# Patient Record
Sex: Female | Born: 1955 | Race: White | Hispanic: No
Health system: Midwestern US, Academic
[De-identification: ages and names within clinical notes are randomized; demographics above are authoritative.]

---

## 2016-06-04 MED ORDER — TOPIRAMATE 50 MG PO TAB
50 mg | ORAL_TABLET | Freq: Every evening | ORAL | 1 refills | Status: DC
Start: 2016-06-04 — End: 2016-11-04

## 2016-07-21 IMAGING — US DUPLEX LOW EXTREM VEINS LT
1 series · 14 of 25 positions shown · non-contrast
Comparison: None available

ULTRASOUND REPORT

US VENOUS LOWER EXTREMITY LEFT
INDICATION: Trauma
TECHNIQUE: Real-time grayscale, color duplex, gated duplex of the left lower
extremity veins

[Series 1: duplex low extrem veins left · 14 of 50 slices shown]
[im 1/50]
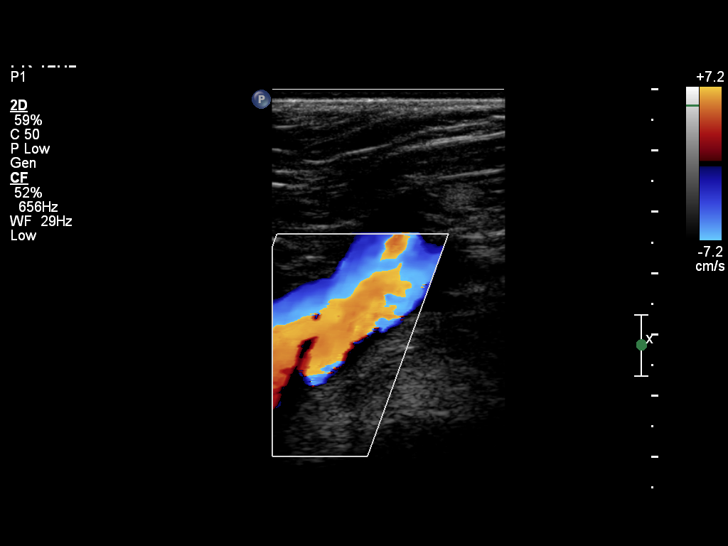
[im 5/50]
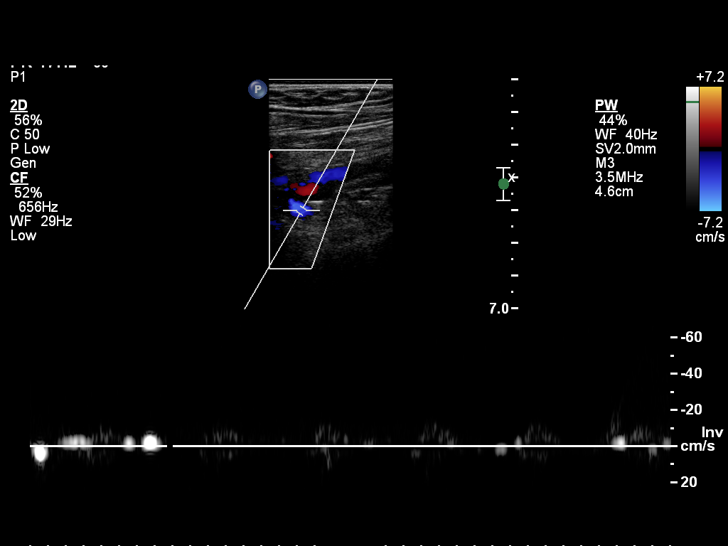
[im 9/50]
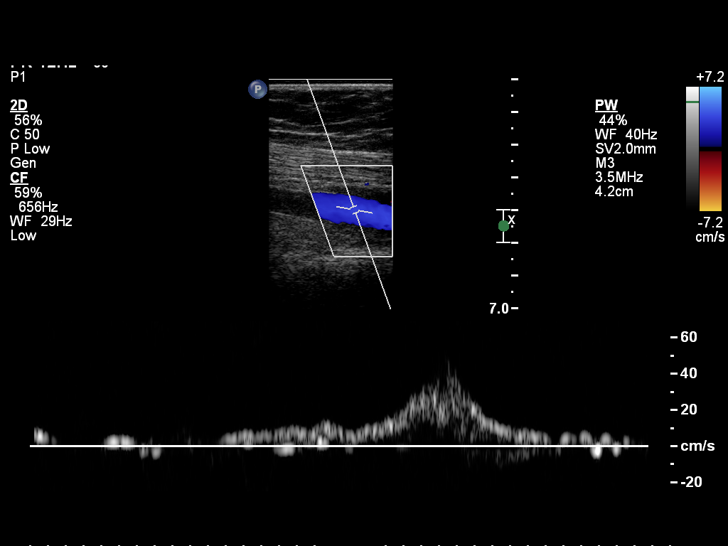
[im 13/50]
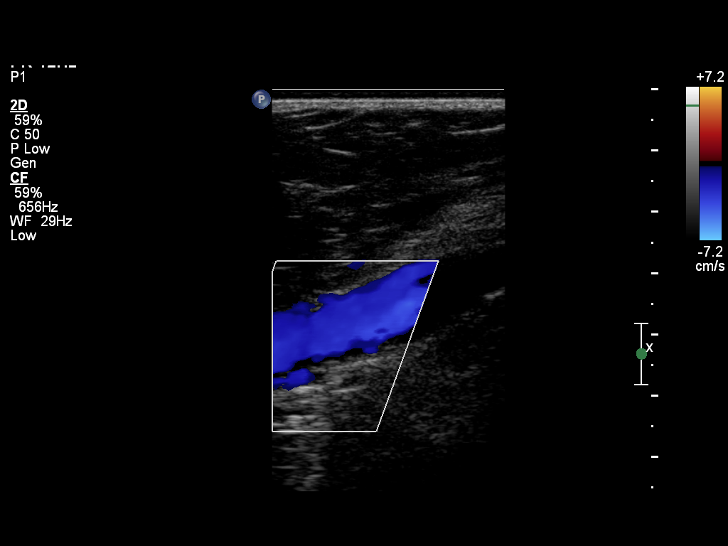
[im 17/50]
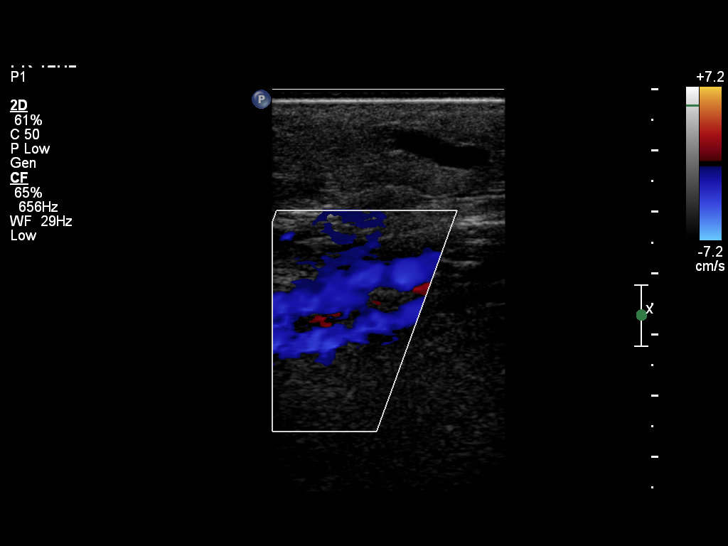
[im 19/50]
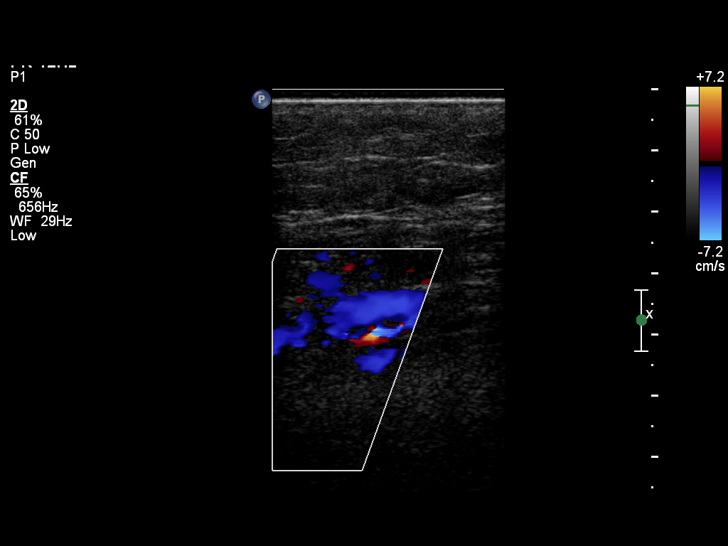
[im 23/50]
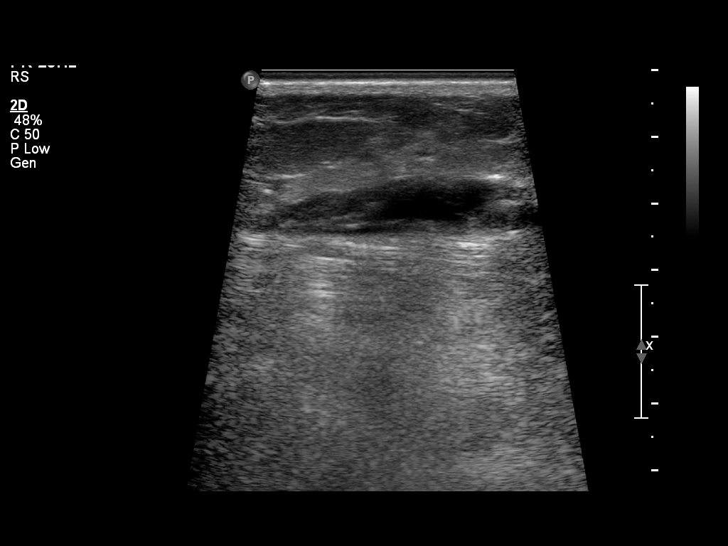
[im 27/50]
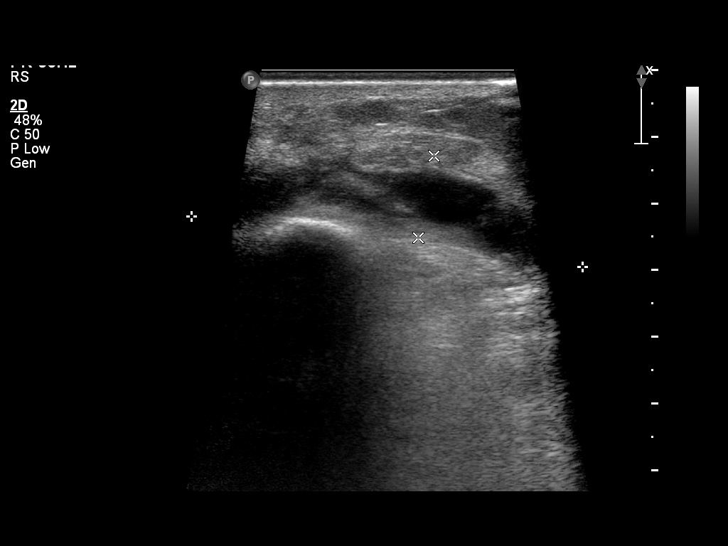
[im 31/50]
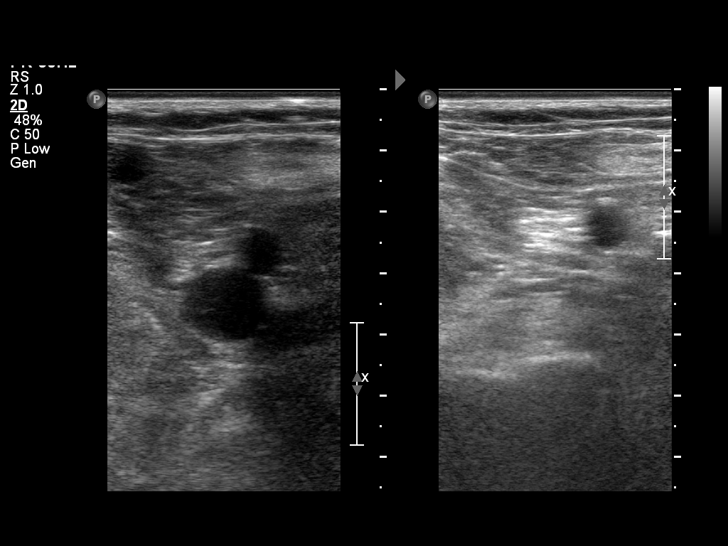
[im 33/50]
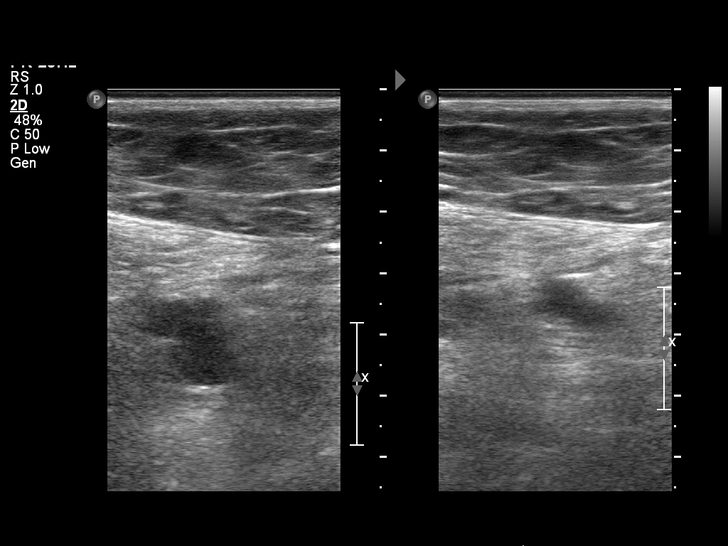
[im 37/50]
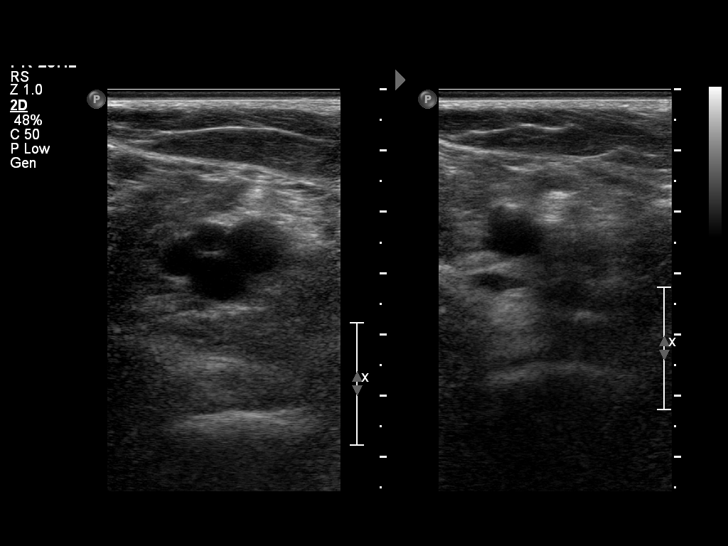
[im 41/50]
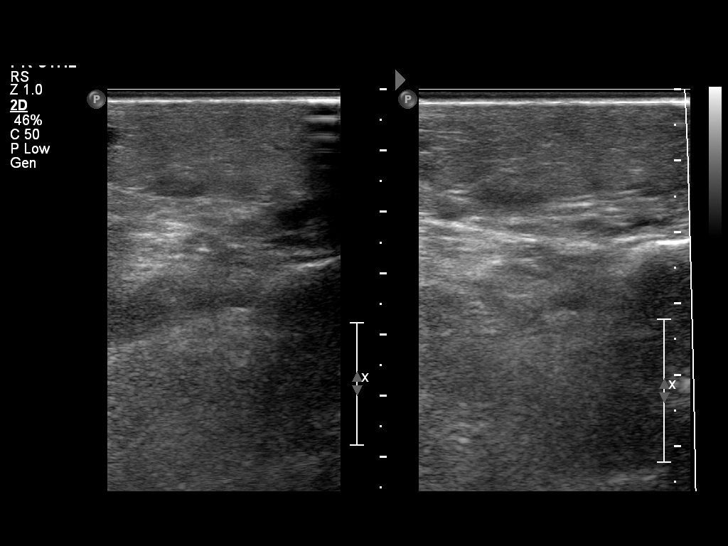
[im 45/50]
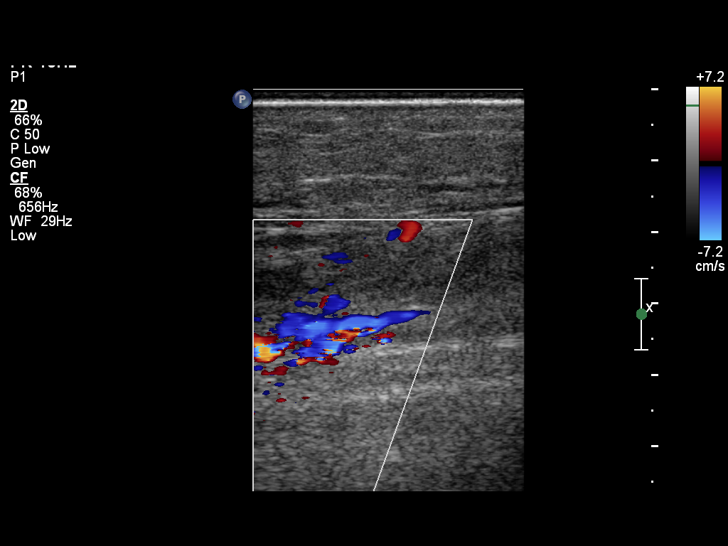
[im 50/50]
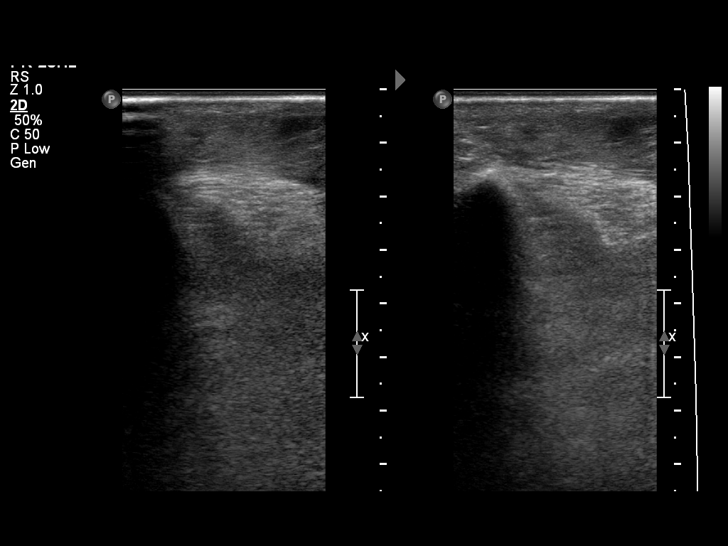

[14 of 25 positions shown; findings below may reference images not displayed]

IMPRESSION: No evidence of venous thrombosis
Subcutaneous hematoma anterior to the proximal tibia and fibula
FINDINGS: There is spontaneous, phasic, augmentable, competent, and nonpulsatile flow
of the left extremity veins
There is no distention or noncompressibility.
There is a 74 ml 59 x 12.5 mm mildly complex hypoechoic area tissues along
the proximal anterior tibia and fibula consistent with a subcutaneous hematoma

Tech Notes: LE SWELLING, BRUISING AFTER FALL. NOVAFORMA

## 2016-08-18 ENCOUNTER — Encounter: Admit: 2016-08-18 | Discharge: 2016-08-18 | Payer: BC Managed Care – PPO

## 2016-08-18 NOTE — Telephone Encounter
Pt is overdue for visit. Janett Billow, LPN

## 2016-08-19 ENCOUNTER — Encounter: Admit: 2016-08-19 | Discharge: 2016-08-19 | Payer: BC Managed Care – PPO

## 2016-08-19 MED FILL — MYCOPHENOLATE SODIUM 180 MG PO TBEC: 180 mg | ORAL | 30 days supply | Qty: 180 | Fill #3 | Status: AC

## 2016-08-19 MED FILL — NALTREXONE 50 MG PO TAB: 50 mg | ORAL | 90 days supply | Qty: 90 | Fill #3 | Status: AC

## 2016-08-19 MED FILL — TACROLIMUS 1 MG PO CAP: 1 mg | ORAL | 30 days supply | Qty: 360 | Fill #3 | Status: AC

## 2016-08-21 MED ORDER — SERTRALINE 50 MG PO TAB
50 mg | ORAL_TABLET | Freq: Every day | ORAL | 2 refills | Status: AC
Start: 2016-08-21 — End: 2016-11-04
  Filled 2016-08-22 (×2): qty 30, 30d supply, fill #1

## 2016-08-21 NOTE — Telephone Encounter
Please ask pt to schedule follow up with me. Thanks

## 2016-08-22 ENCOUNTER — Encounter: Admit: 2016-08-22 | Discharge: 2016-08-22 | Payer: BC Managed Care – PPO

## 2016-08-27 ENCOUNTER — Encounter: Admit: 2016-08-27 | Discharge: 2016-08-27 | Payer: BC Managed Care – PPO

## 2016-08-27 NOTE — Telephone Encounter
Please call pt with recent lab results that were drawn at [facility].

## 2016-08-29 NOTE — Telephone Encounter
Returned call.  Reviewed 08/26/16 lab results with pt, informed of stable results.  Pt v/u.

## 2016-09-02 ENCOUNTER — Encounter: Admit: 2016-09-02 | Discharge: 2016-09-02 | Payer: BC Managed Care – PPO

## 2016-09-02 DIAGNOSIS — Z79899 Other long term (current) drug therapy: ICD-10-CM

## 2016-09-02 DIAGNOSIS — Z944 Liver transplant status: Principal | ICD-10-CM

## 2016-09-08 ENCOUNTER — Encounter: Admit: 2016-09-08 | Discharge: 2016-09-08 | Payer: BC Managed Care – PPO

## 2016-09-08 DIAGNOSIS — Z944 Liver transplant status: Principal | ICD-10-CM

## 2016-09-08 DIAGNOSIS — Z79899 Other long term (current) drug therapy: ICD-10-CM

## 2016-09-08 LAB — COMPREHENSIVE METABOLIC PANEL
Lab: 0.3 mg/dL (ref 0.2–1.0)
Lab: 0.9 mg/dL (ref >60)
Lab: 10 IU/L — ABNORMAL LOW (ref 15–37)
Lab: 106 mmol/L (ref 98–107)
Lab: 12 U/L (ref 12–78)
Lab: 141 mmol/L (ref 136–145)
Lab: 24 mmol/L (ref 21–32)
Lab: 3.5 g/dL (ref 3.4–5.0)
Lab: 4 mmol/L (ref 3.5–5.1)
Lab: 6.1 g/dL — ABNORMAL LOW (ref >60)
Lab: 60 IU/L (ref 46–116)
Lab: 8.7 mg/dL (ref 8.5–10.1)
Lab: 9 mg/dL (ref 7–18)
Lab: 94 mg/dL (ref 74–106)
Lab: >60 mL/min/1.73
Lab: >60 mL/min/{1.73_m2}

## 2016-09-08 LAB — CBC AND DIFF
Lab: 2.9 %
Lab: 4.1 10*3/uL — ABNORMAL LOW (ref 4.5–10.5)

## 2016-09-08 LAB — LIPID PROFILE
Lab: 110 mg/dL (ref 0–129)
Lab: 172 mg/dL (ref 5–200)
Lab: 46 mg/dL (ref 40–60)
Lab: 82 mg/dL — AB (ref 15–150)

## 2016-09-08 LAB — URINALYSIS DIPSTICK REFLEX TO CULTURE
Lab: 0.2 mg/dL (ref <=0.2)
Lab: 1 (ref 1.003–1.035)
Lab: 6.5 (ref 5.0–7.0)
Lab: NEGATIVE
Lab: NEGATIVE

## 2016-09-08 LAB — URINALYSIS MICROSCOPIC REFLEX TO CULTURE

## 2016-09-08 LAB — GGTP: Lab: 8 IU/L (ref 0–60)

## 2016-09-08 LAB — 25-OH VITAMIN D (D2 + D3): Lab: 24 ng/mL — ABNORMAL LOW (ref 30.0–100.0)

## 2016-09-09 ENCOUNTER — Encounter: Admit: 2016-09-09 | Discharge: 2016-09-09 | Payer: BC Managed Care – PPO

## 2016-09-09 DIAGNOSIS — Z79899 Other long term (current) drug therapy: ICD-10-CM

## 2016-09-09 DIAGNOSIS — Z944 Liver transplant status: Principal | ICD-10-CM

## 2016-09-15 ENCOUNTER — Encounter: Admit: 2016-09-15 | Discharge: 2016-09-15 | Payer: BC Managed Care – PPO

## 2016-09-15 MED FILL — MYCOPHENOLATE SODIUM 180 MG PO TBEC: 180 mg | ORAL | 30 days supply | Qty: 180 | Fill #4 | Status: AC

## 2016-09-15 MED FILL — TACROLIMUS 1 MG PO CAP: 1 mg | ORAL | 30 days supply | Qty: 360 | Fill #4 | Status: AC

## 2016-09-17 ENCOUNTER — Encounter: Admit: 2016-09-17 | Discharge: 2016-09-17 | Payer: BC Managed Care – PPO

## 2016-09-23 ENCOUNTER — Encounter: Admit: 2016-09-23 | Discharge: 2016-09-23 | Payer: BC Managed Care – PPO

## 2016-09-23 NOTE — Progress Notes
Attempted to call Audrey Oliver to verify compliance and assess tolerance of her specialty medications (tacrolimus, mycophenolate). No answer. Left voicemail asking patient to return call to pharmacist at [phone].    Anson Crofts, [facility]

## 2016-09-26 ENCOUNTER — Encounter: Admit: 2016-09-26 | Discharge: 2016-09-26 | Payer: BC Managed Care – PPO

## 2016-09-26 MED FILL — TOPIRAMATE 50 MG PO TAB: 50 mg | ORAL | 90 days supply | Qty: 90 | Fill #1 | Status: AC

## 2016-09-26 NOTE — Progress Notes
Periodic Immunosuppressant Medication Reassessment   Prograf (tacrolimus) ??? calcineurin inhibitor  Myfortic (mycophenolate sodium) ??? antimetabolite    Appropriateness of Therapy:  Tacrolimus and mycophenolate have been appropriately prescribed for immunosuppression following organ transplantation for  Audrey Oliver.    The following doses and frequencies have been prescribed:    ??? Tacrolimus 2 mg by mouth twice daily; dosing to be modified for a trough goal of ~5 mcg/mL  ???  ??? Mycophenolate sodium 180 mg twice daily    These medications will be continued indefinitely or for a duration determined to be appropriate by the transplant prescriber.    The immunosuppressive regimen above has been prescribed to prevent rejection after liver and kidney transplantation.    Allergies:  A review of the patient???s allergies and medication reconciliation have been completed.    Allergies   Allergen Reactions   ??? Morphine ITCHING   ??? Erythromycin STOMACH UPSET       Medication Reconciliation:  A medication history and reconciliation were performed (including prescription medications, supplements, over the counter, and herbal products). The medication list was updated and the patients??? current medication list is included below.  Prior to Admission medications    Medication Sig Start Date End Date Taking? Authorizing Provider   aspirin EC 81 mg tablet Take 81 mg by mouth daily. Take with food.    HISTORICAL PROVIDER   cholecalciferol (Vitamin D3) (VITAMIN D-3) 1,000 units tablet Take 2,000 Units by mouth daily.    Provider, Historical   gabapentin (NEURONTIN) 100 mg capsule Take 1 capsule by mouth three times daily. 05/07/16   Rodena Piety, MD   mycophenolate DR (MYFORTIC) 180 mg TbEC tablet Take 1 tablet by mouth twice daily. 05/07/16   Rodena Piety, MD   naltrexone (DEPADE) 50 mg tablet Take 1 tablet by mouth daily. 05/07/16   Rodena Piety, MD   omeprazole DR(+) (PRILOSEC) 20 mg capsule Take 1 capsule by mouth daily. 05/07/16   Rodena Piety, MD   propranolol (INDERAL) 10 mg tablet Take 2 tablets by mouth twice daily. 05/07/16   Rodena Piety, MD   sertraline (ZOLOFT) 50 mg tablet TAKE ONE TABLET BY MOUTH ONCE DAILY 08/21/16   Polsak, Micholee B, DO   tacrolimus (PROGRAF) 1 mg capsule Take 2 capsules by mouth twice daily. 05/07/16   Rodena Piety, MD   topiramate (TOPAMAX) 50 mg tablet Take 1 tablet by mouth at bedtime daily. 06/04/16   Rodena Piety, MD   traZODone (DESYREL) 100 mg tablet Take 1 tablet by mouth at bedtime daily. 05/07/16   Rodena Piety, MD   vitamins, multiple tablet Take 1 Tab by mouth daily.    HISTORICAL PROVIDER       Drug-Drug Interactions:  Drug-drug and drug-food interactions between the patients??? specialty medication and their medication list were assessed and reviewed with the patient.     The patient was counseled to avoid taking non-steroidal anti-inflammatory drugs or herbal supplements and to contact their transplant physician or pharmacist before stopping or starting any medications.    Drug-Food Interactions:  The patient was counseled to avoid consuming grapefruit and grapefruit juice or pomegranate and pomegranate juice.  While their medications can be taken without regard to food, the patient was strongly encouraged to take their medications with food for better tolerability.    Pregnancy status:    Female of child-bearing potential:  provided with education to contact her physician if she becomes pregnant or plans to become pregnant.    REMS programs:  No REMS are required for any of the prescribed medications for this patient.     Patient Assessments:    Adverse Effects:  Side effects of medications were questioned and the patient reported they are not experiencing any significant adverse effects to the medication(s)    Adherence:  Patient???s adherence to therapy was reviewed and they self-reported they have missed 7 doses per week.  Recommended setting an alarm on phone to help with adherence Audrey Oliver was educated on the importance of adherence.    Audrey Oliver is adherent with medication refills with refills     Subjective clinical assessment: on a scale of 1 to 10, the patient rates they are feeling 10 out of 10 while on treatment.    Subjective Quality of Life Measurement:  In the past 30 days, Audrey Oliver was able to complete all normal daily activities.    Audrey Oliver  was given the opportunity to ask questions and did not have any questions at this time.     Followup Plan:  Re-assessment has been completed. The patient will be assessed again in 1 year.      Morrison Old, PHARMD

## 2016-09-29 ENCOUNTER — Encounter: Admit: 2016-09-29 | Discharge: 2016-09-29 | Payer: BC Managed Care – PPO

## 2016-10-17 ENCOUNTER — Encounter: Admit: 2016-10-17 | Discharge: 2016-10-17 | Payer: BC Managed Care – PPO

## 2016-10-17 MED FILL — MYCOPHENOLATE SODIUM 180 MG PO TBEC: 180 mg | ORAL | 30 days supply | Qty: 180 | Fill #5 | Status: AC

## 2016-10-17 MED FILL — TACROLIMUS 1 MG PO CAP: 1 mg | ORAL | 30 days supply | Qty: 360 | Fill #5 | Status: AC

## 2016-10-21 ENCOUNTER — Encounter: Admit: 2016-10-21 | Discharge: 2016-10-21 | Payer: BC Managed Care – PPO

## 2016-10-30 ENCOUNTER — Encounter: Admit: 2016-10-30 | Discharge: 2016-10-30 | Payer: BC Managed Care – PPO

## 2016-10-30 NOTE — Telephone Encounter
Mary from [facility] LVM requesting new standing orders for pt be sent to [phone].

## 2016-10-31 ENCOUNTER — Encounter: Admit: 2016-10-31 | Discharge: 2016-10-31 | Payer: BC Managed Care – PPO

## 2016-10-31 DIAGNOSIS — Z940 Kidney transplant status: Principal | ICD-10-CM

## 2016-10-31 DIAGNOSIS — B349 Viral infection, unspecified: ICD-10-CM

## 2016-10-31 NOTE — Progress Notes
Patients chart is scanned in [facility] box number. [id number]

## 2016-11-04 ENCOUNTER — Ambulatory Visit: Admit: 2016-11-04 | Discharge: 2016-11-04 | Payer: MEDICARE

## 2016-11-04 ENCOUNTER — Encounter: Admit: 2016-11-04 | Discharge: 2016-11-04 | Payer: BC Managed Care – PPO

## 2016-11-04 ENCOUNTER — Ambulatory Visit: Admit: 2016-11-04 | Discharge: 2016-11-05 | Payer: BC Managed Care – PPO

## 2016-11-04 ENCOUNTER — Ambulatory Visit: Admit: 2016-11-04 | Discharge: 2016-11-04 | Payer: BC Managed Care – PPO

## 2016-11-04 DIAGNOSIS — I499 Cardiac arrhythmia, unspecified: ICD-10-CM

## 2016-11-04 DIAGNOSIS — Z79899 Other long term (current) drug therapy: ICD-10-CM

## 2016-11-04 DIAGNOSIS — R609 Edema, unspecified: ICD-10-CM

## 2016-11-04 DIAGNOSIS — Z940 Kidney transplant status: Secondary | ICD-10-CM

## 2016-11-04 DIAGNOSIS — I10 Essential (primary) hypertension: ICD-10-CM

## 2016-11-04 DIAGNOSIS — K5792 Diverticulitis of intestine, part unspecified, without perforation or abscess without bleeding: ICD-10-CM

## 2016-11-04 DIAGNOSIS — Z944 Liver transplant status: Principal | ICD-10-CM

## 2016-11-04 DIAGNOSIS — F1010 Alcohol abuse, uncomplicated: ICD-10-CM

## 2016-11-04 DIAGNOSIS — D259 Leiomyoma of uterus, unspecified: ICD-10-CM

## 2016-11-04 DIAGNOSIS — E669 Obesity, unspecified: ICD-10-CM

## 2016-11-04 DIAGNOSIS — B349 Viral infection, unspecified: ICD-10-CM

## 2016-11-04 DIAGNOSIS — Z9289 Personal history of other medical treatment: ICD-10-CM

## 2016-11-04 DIAGNOSIS — T7840XA Allergy, unspecified, initial encounter: ICD-10-CM

## 2016-11-04 DIAGNOSIS — G4730 Sleep apnea, unspecified: ICD-10-CM

## 2016-11-04 DIAGNOSIS — K7290 Hepatic failure, unspecified without coma: Principal | ICD-10-CM

## 2016-11-04 DIAGNOSIS — E785 Hyperlipidemia, unspecified: ICD-10-CM

## 2016-11-04 DIAGNOSIS — D539 Nutritional anemia, unspecified: ICD-10-CM

## 2016-11-04 DIAGNOSIS — R748 Abnormal levels of other serum enzymes: ICD-10-CM

## 2016-11-04 DIAGNOSIS — F419 Anxiety disorder, unspecified: ICD-10-CM

## 2016-11-04 DIAGNOSIS — Z780 Asymptomatic menopausal state: ICD-10-CM

## 2016-11-04 DIAGNOSIS — N289 Disorder of kidney and ureter, unspecified: ICD-10-CM

## 2016-11-04 LAB — COMPREHENSIVE METABOLIC PANEL
Lab: 138 MMOL/L (ref 137–147)
Lab: 16 mg/dL (ref 7–25)
Lab: 4.1 MMOL/L (ref 3.5–5.1)
Lab: 9.2 mg/dL (ref 8.5–10.6)
Lab: 97 mg/dL (ref 70–100)

## 2016-11-04 LAB — URINALYSIS DIPSTICK REFLEX TO CULTURE
Lab: NEGATIVE
Lab: NEGATIVE K/UL (ref 0–0.20)
Lab: NEGATIVE mL/min (ref 0–0.45)
Lab: NEGATIVE
Lab: NEGATIVE

## 2016-11-04 LAB — URINALYSIS MICROSCOPIC REFLEX TO CULTURE

## 2016-11-04 LAB — TACROLIMUS IMMUNOASSAY (FK506): Lab: 6.1 ng/mL (ref 2–15)

## 2016-11-04 LAB — CBC AND DIFF
Lab: 4.8 K/UL (ref 4.5–11.0)
Lab: 4.9 M/UL (ref 4.0–5.0)

## 2016-11-04 LAB — GGTP: Lab: 10 U/L — ABNORMAL HIGH (ref 9–64)

## 2016-11-04 MED ORDER — MYCOPHENOLATE SODIUM 180 MG PO TBEC
180 mg | ORAL_TABLET | Freq: Two times a day (BID) | ORAL | 1 refills | Status: AC
Start: 2016-11-04 — End: 2017-05-15
  Filled 2016-11-05 (×2): qty 180, 30d supply, fill #1

## 2016-11-04 MED ORDER — TRAZODONE 100 MG PO TAB
100 mg | ORAL_TABLET | Freq: Every evening | ORAL | 1 refills | Status: AC
Start: 2016-11-04 — End: 2017-05-15
  Filled 2016-11-05 (×2): qty 90, 90d supply, fill #1

## 2016-11-04 MED ORDER — FUROSEMIDE 20 MG PO TAB
20 mg | ORAL_TABLET | Freq: Every day | ORAL | 1 refills | 90.00000 days | Status: AC | PRN
Start: 2016-11-04 — End: 2017-07-23

## 2016-11-04 MED ORDER — OMEPRAZOLE 20 MG PO CPDR
20 mg | ORAL_CAPSULE | Freq: Every day | ORAL | 1 refills | Status: AC
Start: 2016-11-04 — End: 2017-05-15
  Filled 2016-11-05 (×2): qty 90, 90d supply, fill #1

## 2016-11-04 MED ORDER — TACROLIMUS 1 MG PO CAP
2 mg | ORAL_CAPSULE | Freq: Two times a day (BID) | ORAL | 1 refills | 30.00000 days | Status: AC
Start: 2016-11-04 — End: 2017-05-15
  Filled 2016-11-05 (×2): qty 360, 30d supply, fill #1

## 2016-11-04 MED ORDER — SERTRALINE 50 MG PO TAB
50 mg | ORAL_TABLET | Freq: Every day | ORAL | 1 refills | Status: AC
Start: 2016-11-04 — End: 2017-09-21

## 2016-11-04 MED ORDER — PROPRANOLOL 10 MG PO TAB
20 mg | ORAL_TABLET | Freq: Two times a day (BID) | ORAL | 1 refills | Status: AC
Start: 2016-11-04 — End: 2017-05-15
  Filled 2016-11-05 (×2): qty 360, 90d supply, fill #1

## 2016-11-04 NOTE — Telephone Encounter
Pt LVM returning call to [facility] regarding lab results.

## 2016-11-04 NOTE — Telephone Encounter
Called and reviewed lab results with patient.  Cr and LFT's stable.  Prograf level 6.1.  IS managed by liver tsp team.

## 2016-11-04 NOTE — Progress Notes
BP at home 120/80.  Denies N/V/heartburn/diarrhea/constipation/dysuria.  Furosemide 20mg  prn use for LE swelling.  Needs refill.    Last Prograf dosage 21:45  RTC in 6 months  Have labs done every 2 months

## 2016-11-04 NOTE — Progress Notes
11/04/16  Audrey Oliver  DOB: 08/21/1955  MRN: [id number]      Date of Service: 11/04/2016          History of Present Illness    Audrey Oliver is a 61 y.o. 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 She has resumed alcohol intake post transplant that required inpatient rehab. She endorses alcohol abstinence since 02/24/2016. She has been taking care of her daughter that is suffering from liver complications due to alcohol abuse. Her daughter lives in [place] and she has been traveling there. She presents to our transplant clinic today for routine follow up. Her liver graft function remains normal.     Her home blood pressures are 120s/80s. She has lost weight by using CBD oil weight loss spray. 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 She has lost 15 pounds since starting this. She reports improvement in her energy and sleep mood.    She takes Furosemide on occasion for BLE. She is trying to stop smoking but due to recent family stressors, she has had difficulty with tobacco cessation.    TRANSPLANT SYNOPSIS:  Date:02/23/2012  Transplant Type:SLK (alcohol, HRS)  KDPI:9%  Induction:Thymoglobulin      Review of Systems  Constitutional/General: Negative for fever, chills, weakness, anorexia, fatigue, malaise or unintentional weight loss/weight gain. As per HPI  HEENT:  Negative for headache, diplopia, tinnitus, rhinorrhea, epistaxis, sore throat, mouth ulcers  Lymphatics: Negative for enlarged lymph nodes  Respiratory: Negative for cough, SOA, DOE  Cardiovascular: Negative for chest pain, SOA, palpitations, orthopnea, PND, swelling  Gastrointestinal: Negative for abdominal pain, nausea, vomiting, diarrhea, constipation  Genitourinary: Negative for flank pain, burning micturition, frequency, bloody urine, oliguria  Extremities: Negative for leg swelling  M/S: Negative for joint swelling, redness, pain Skin: Negative for rash, itching or lesions  Endocrine: Negative for sweating, cold or heat intolerance. No polyuria or polydipsia  Neurologic: Negative for muscle weakness, numbness, tingling.  Hematologic: Negative for anemia, bleeding or bruising  Psychiatric: Negative for thought/mood disorder  Allergic/Immunologic: Negative for hives, pruritus      Pertinent positive and negative systems are noted above and/or in HPI          Allergies   Allergen Reactions   ??? Morphine ITCHING   ??? Erythromycin STOMACH UPSET         Past Medical History:   Diagnosis Date   ??? Alcohol abuse    ??? Allergy    ??? Anxiety disorder    ??? Arrhythmia     with anxiety and had some fluttering with fistula placement 10/03/11   ??? Depression (disease)     used to be on prozac   ??? Diverticulitis     s/p sigmoid resection   ??? Edema    ??? Elevated liver enzymes    ??? End-stage liver disease (HCC)     2006, quit drinking, relapsed and started drinking in March and decompensated; s/p OLT 12/13    ??? History of blood transfusion    ??? HTN (hypertension)     with anxiety   ??? Hyperlipemia     LDL recently 140 and patient denies any h/o high cholesterol in the past   ??? Kidney disease     Hepatorenal on HD short time s/p Kidney transplant 12/13   ??? Obesity    ??? Postmenopausal     05/2011 last menstrual period   ??? Sleep apnea  Prior with 100lbs weight loss with possible diagnosis   ??? Unspecified deficiency anemia    ??? Uterine fibroid     s/p Hysteroscopy, polypectomy, dilation, and curettage       Past Surgical History:   Procedure Laterality Date   ??? CARDIOVASCULAR STRESS TEST  2011    at [facility] - Passed with flying colors   ??? DOPPLER ECHOCARDIOGRAPHY  10/2011    at [facility]   ??? DILATION AND CURETTAGE  11/08/12    Hysteroscopy, polypectomy, dilation, and curettage   ??? AV FISTULA REPAIR     ??? DIALYSIS FISTULA CREATION      Right lower arm   ??? ELECTROCARDIOGRAM     ??? HX BOWEL RESECTION      Sigmoid from diverticulitis   ??? HX CHOLECYSTECTOMY ??? HX TONSILLECTOMY     ??? KIDNEY TRANSPLANT     ??? LIVER TRANSPLANT         Family History   Problem Relation Age of Onset   ??? Unknown to Patient Mother    ??? Unknown to Patient Father    ??? Anesthetic Complication Neg Hx    ??? Cancer Neg Hx    ??? Cancer-Breast Neg Hx    ??? Cancer-Colon Neg Hx    ??? Cancer-Ovarian Neg Hx    ??? Cancer-Uterine Neg Hx    ??? Bleeding Disorders Neg Hx    ??? Diabetes Neg Hx    ??? Heart Disease Neg Hx    ??? VTE Neg Hx      Social History     Social History   ??? Marital status: Separated     Spouse name: N/A   ??? Number of children: N/A   ??? Years of education: N/A     Occupational History   ???  Disabled     Social History Main Topics   ??? Smoking status: Current Every Day Smoker     Packs/day: 1.00     Years: 30.00     Types: Cigarettes     Last attempt to quit: 12/02/2010   ??? Smokeless tobacco: Never Used   ??? Alcohol use No      Comment: started age 39; Quit in 2006 and started back in 2011, quit March 2013. Used to drink vodka, though wine/beer this last time.   ??? Drug use: No   ??? Sexual activity: Yes     Partners: Male      Comment: no h/o STD     Other Topics Concern   ??? Not on file     Social History Narrative    From [place], graduated from [employer], lived in [place] 12 years, moved to [place] 25 yrs, then here in 2010.  She was a hair stylist and women's retail.  Last worked a couple years ago.  Boyfriend is Gwynneth Munson, still married to [place] but legally separated and they are all 3 buddies.  1 daughter, Audrey Oliver - in [place].          Objective:           Vitals:    11/04/16 0911   BP: 130/85   Pulse: 73   Weight: 108.9 kg (240 lb)     Body mass index is 36.49 kg/m???.     Physical Exam    General: NAD, A+Ox4, calm and pleasant. Appears to be stated age.   HENT: Unremarkable, no oral lesions  Neck: Normal ROM, no LAD, no JVD  Lungs: Bilat. CTA  CV:  RRR, S1, S2 without carotid bruit or murmur, no edema  Abdomen: Soft, N/D, N/T without hepatosplenomegaly  M/S: Normal ROM and strength Neuro: Nonfocal deficits without tremors  Skin: No skin rash  Psyc: Stable        ??? aspirin EC 81 mg tablet Take 81 mg by mouth daily. Take with food.   ??? cholecalciferol (Vitamin D3) (VITAMIN D-3) 1,000 units tablet Take 2,000 Units by mouth daily.   ??? mycophenolate DR (MYFORTIC) 180 mg TbEC tablet Take 1 tablet by mouth twice daily.   ??? omeprazole DR(+) (PRILOSEC) 20 mg capsule Take one capsule by mouth daily.   ??? propranolol (INDERAL) 10 mg tablet Take two tablets by mouth twice daily.   ??? sertraline (ZOLOFT) 50 mg tablet Take one tablet by mouth daily.   ??? tacrolimus (PROGRAF) 1 mg capsule Take 2 capsules by mouth twice daily.   ??? traZODone (DESYREL) 100 mg tablet Take one tablet by mouth at bedtime daily.   ??? vitamins, multiple tablet Take 1 Tab by mouth daily.       Labs and Diagnostic Tests:  Urinalysis:  Lab Results   Component Value Date/Time    UCOLOR Yellow 08/26/2016 08:15 AM    TURBID Clear 08/26/2016 08:15 AM    USPGR 1.015 08/26/2016 08:15 AM    UPH 6.5 08/26/2016 08:15 AM    UPROTEIN Negative 08/26/2016 08:15 AM    UAGLU Negative 08/26/2016 08:15 AM    UKET Negative 08/26/2016 08:15 AM    UBILE Negative 08/26/2016 08:15 AM    UBLD NEGATIVE 08/26/2016 08:15 AM    UROB 0.2 08/26/2016 08:15 AM       CBC with Diff:  CBC with Diff Latest Ref Rng & Units 08/26/2016 05/05/2016   WBC 4.5 - 10.5 K/ul 4.1(L) 4.5   RBC 4.20 - 5.40 ul 5.03 4.79   HGB 12.5 - 16.0 g/dl 16.1 15.3(H)   HCT 37 - 47 % 42 46.7(H)   MCV 78 - 100 10:3 84 97.5   MCH 27 - 31 pg 30 31.9   MCHC 32 - 36 g/dl 35 09.6   RDW 04.5 - 40.9 % 12.2 14.4   PLT 150 - 450 th/ul 205 220   MPV 7 - 11 FL - 8.0   NEUT 37.5 - 70.0 % 64.6 72   ANC 1.5 - 6.6 2.6 3.20   LYMA 24 - 44 % - 21(L)   ALYM 0.9 - 5.0 1.0 0.90(L)   MONA 4 - 12 % - 6   AMONO 0.3 - 1.0 0.3 0.30   EOSA 0 - 5 % - 1   AEOS 0.0 - 2.0 0.1 0.10   BASA 0 - 2 % - 0   ABAS 0 - 0.20 K/UL - 0.00       CMP:  CMP Latest Ref Rng & Units 08/26/2016 05/05/2016   NA 136 - 145 mmol/L 141 139 K 3.5 - 5.1 mmol/L 4.0 3.6   CL 98 - 107 mmol/L 106 109   CO2 21 - 32 mmol/L 24 25   GAP 3 - 12 - 5   BUN 7 - 18 mg/dl 9 12   CR 8.11 - 9.14 mg/dl 7.82 9.56(O)   GLUX 70 - 100 MG/DL - 85   CA 8.5 - 13.0 mg/dl 8.7 9.4   TP 6.4 - 8.2 g/dl 6.1(L) 6.6   ALB 3.4 - 5.0 g/dL 3.5 4.1   ALKP 46 - 865 IU/L 60 51   ALT 12 -  78 U/L 12 4(L)   TBILI 0.2 - 1.0 mg/dl 0.3 0.3   GFR ZO/XWR/6.04 >60 54(L)   GFRAA mL/min/1.73 >60 >60       CMV DNA Quant PCR   Date Value   05/05/2016 CMV DNA NOT DETECTED [IU]/mL   06/29/2012 negative   06/16/2012 negative   05/17/2012 Negative for CMV   05/11/2012 negative     BK Virus Plasma Quant (no units)   Date Value   03/06/2015 Negative for BK Virus   11/23/2012 Negative for BK Virus     No results found for: EBVDNAQT    Other Common Labs:  Other Common Labs Latest Ref Rng & Units 08/26/2016 05/05/2016   IRON - - -   TIBC - - -   PSAT - - -   FER - - -   PO4 2.0 - 4.0 MG/DL - 3.9   MG 1.6 - 2.6 mg/dL - 1.7   VITD 54.0 - 981.1 ng/mL 24.6(L) -   UPRO NEGATIVE Negative NEG   UCRR MG/DL - -   URICA 2.0 - 7.0 MG/DL - -   BJY7W 4.0 - 6.0 % - -   Tacrolimus 2 - 15 NG/ML - 6.9       Imaging:  Results for orders placed during the hospital encounter of 12/30/12   CT ABD/PELV W CONTRAST    Impression CT ABD/PELV W CONTRAST          IMPRESSION:    1. PRIOR LIVER TRANSPLANT WITH PATENT MAJOR PORTAL VEINS. NO ASCITES OR  SPLENOMEGALY TO INDICATE PORTAL HYPERTENSION, WITH INTERVAL RESOLUTION OF  MILD SPLENOMEGALY.    2. NO BOWEL OBSTRUCTION.    3. RIGHT ILIAC FOSSA RENAL TRANSPLANT WITHOUT EVIDENCE FOR  HYDRONEPHROSIS. ATROPHIC NATIVE KIDNEYS.    4. DECREASE IN SIZE OF RIGHT ILIAC FOSSA RENAL TRANSPLANT FLUID  COLLECTION WHICH LIKELY REPRESENTS A SEROMA.    5. 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    Residents: Jacques Navy  M.D.  Electronically signed on: Dec 30 2012  5:14AM by Jonny Ruiz VADAPARAMPIL      By my electronic signature, I attest that I have personally reviewed the images for this examination and formulated the interpretations and  opinions expressed in this report.       Results for orders placed during the hospital encounter of 06/05/15   KNEE 3 VIEWS RIGHT    Impression Subacute to chronic appearing right tibial spine fracture.    Findings were discussed with Dr. Chesley Noon at 60 4:00 PM by phone of 06/05/2015.    By my electronic signature, I attest that I have personally reviewed the images for this examination and formulated the interpretations and opinions expressed in this report       Finalized by Myles Lipps, M.D. on 06/05/2015 7:08 PM. Dictated by Lars Mage, M.D. on 06/05/2015 6:47 PM.                 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  UA remains bland.    Liver transplant  LFTs normal. Followed by hepatology.    Immunosuppression  Prednisone has been stopped in the liver clinic today.  Will remain on dual drug therapy.   Prograf goal ~5ng/mL  BKV negative 09/2015, pending from today    Fluid and electrolytes  Prn lasix-if BLE edema worsens  then plan to further eval.  Electrolytes normal.    Prophylaxis  Taking low dose aspirin.    Tobacco abuse  Smoking 1 ppd. Counseled regarding quitting.   Is interested in nicotine replacement OTC as her family stress is relieved.       Hypertension  Stable. Instructed to monitor blood pressure at home and she voiced understanding.    Obesity   Counseled regarding ongoing weight loss.   Pt using CBD oil    Health maintenance by PCP  Flu vaccine this season  Recommend annual mammogram and Pap Smear         RTC in 6 months  Labs every two months    Martyn Ehrich, APRN  [phone]    Cc: Christean Leaf  Cc: Rodena Piety

## 2016-11-04 NOTE — Progress Notes
CHIEF COMPLAINT/PURPOSE FOR VISIT:  Routine followup patient with history of combined liver kidney transplant for cirrhotic stage liver disease due to alcohol.     HISTORY OF PRESENT ILLNESS:  Audrey Oliver is a very pleasant 61 year old female, who underwent simultaneous liver-kidney transplant at our facility for advanced liver disease due to alcohol on February 23, 2012.  Since her combined transplant, Audrey Oliver has done well from an organ function standpoint.  There has been no history of rejection or other graft dysfunction in of either her liver or kidney.  Unfortunately, Audrey Oliver has had problems with alcohol relapse on more than one occasion. Audrey Oliver has been sober since December of 2017.  Her daughter who also has alcohol use issues nearly died from liver failure earlier this year.      At this time, Audrey Oliver is again sober for which Audrey Oliver has been for several months after a recent treatment in an inpatient rehabilitation facility in [place]. Audrey Oliver is using CBD oil for control of symptoms.     On today's visit, Audrey Oliver endorses continued abstinence with improved attitude toward sobriety.  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    Audrey Oliver endorses no other worrisome health concerns or complaints today.     REVIEW OF SYSTEMS:  A 10-point review of systems is performed.  It is unremarkable.     PHYSICAL EXAMINATION:   General:  Alert oriented and appropriate.  Body mass index is 36.49 kg/m???.  HEENT:  PERRL EOMI  Respriatory:  Normal breaths sounds noted in all lung fields, no adventitious sounds.  Cardiovascular:  Regular rate and rhythm, without murmur.  Abdomen:  Soft, non-tender, non-distended.  Extremities:  No clubbing, cyanosis, or edema.  Neurologic:  Oriented to person, place, time, and event.  No asterixis or tremor.  Skin:  No acute rashes or lesions.      IMPRESSION/REPORT/PLAN: #1.  Status post simultaneous liver-kidney transplant:  Audrey Oliver continues to do well from a liver and kidney function standpoint.  Her liver tests today are entirely normal.  There has been no history of rejection or other evidence of graft dysfunction.     #2.  Chronic immunosuppression management:  Audrey Oliver remains on dual therapy with Prograf 2 mg b.i.d. and Myfortic 180 mg b.i.d.  We will make no changes to her regimen today.     #3.  History of alcohol recidivism:  Sober at this time off of naltrexone and acamprosate.  We continue to support her.  We are generally opposed to supplements such as CBD oil, but this seems to be helping her and we prefer this over alcohol.  We have no opposition to continued use.      #4:  Routine health maintenance:  Audrey Oliver is up to date on routine health screenings.     #5:  Follow-up:  We would like to see her back in clinic in a year sooner if Audrey Oliver has any worrisome changes in her health status.

## 2016-11-05 ENCOUNTER — Encounter: Admit: 2016-11-05 | Discharge: 2016-11-05 | Payer: BC Managed Care – PPO

## 2016-11-05 LAB — BK VIRUS DNA, QUANT PLASMA

## 2016-11-05 NOTE — Telephone Encounter
Reviewed 11/04/16 lab results and [place] report with pt.  Informed of stability, no medication changes, pt will repeat labs q1-2 months.

## 2016-11-18 ENCOUNTER — Encounter: Admit: 2016-11-18 | Discharge: 2016-11-18 | Payer: BC Managed Care – PPO

## 2016-11-18 NOTE — Telephone Encounter
Faxed lab orders to [facility] @ [phone] as requested.

## 2016-11-18 NOTE — Telephone Encounter
Please fax new standing orders for pts labs to [facility].

## 2016-11-27 ENCOUNTER — Encounter: Admit: 2016-11-27 | Discharge: 2016-11-27 | Payer: BC Managed Care – PPO

## 2016-11-27 NOTE — Telephone Encounter
Audrey Oliver from [facility] called requesting standing orders be faxed to their location. [name] states orders were faxed, but expire this month. Audrey Oliver requests orders be good for 6-12 months if possible.

## 2016-12-01 ENCOUNTER — Encounter: Admit: 2016-12-01 | Discharge: 2016-12-01 | Payer: BC Managed Care – PPO

## 2016-12-01 NOTE — Telephone Encounter
Faxed patient standing CBC w/diff, CMP and GGTP orders. Audrey Oliver

## 2016-12-04 ENCOUNTER — Encounter: Admit: 2016-12-04 | Discharge: 2016-12-04 | Payer: BC Managed Care – PPO

## 2016-12-10 ENCOUNTER — Encounter: Admit: 2016-12-10 | Discharge: 2016-12-10 | Payer: BC Managed Care – PPO

## 2016-12-10 MED FILL — MYCOPHENOLATE SODIUM 180 MG PO TBEC: 180 mg | ORAL | 30 days supply | Qty: 180 | Fill #2 | Status: AC

## 2016-12-10 MED FILL — TACROLIMUS 1 MG PO CAP: 1 mg | ORAL | 30 days supply | Qty: 360 | Fill #2 | Status: AC

## 2016-12-11 ENCOUNTER — Encounter: Admit: 2016-12-11 | Discharge: 2016-12-11 | Payer: BC Managed Care – PPO

## 2017-01-07 ENCOUNTER — Encounter: Admit: 2017-01-07 | Discharge: 2017-01-07 | Payer: BC Managed Care – PPO

## 2017-01-12 ENCOUNTER — Encounter: Admit: 2017-01-12 | Discharge: 2017-01-12 | Payer: BC Managed Care – PPO

## 2017-01-13 ENCOUNTER — Encounter: Admit: 2017-01-13 | Discharge: 2017-01-13 | Payer: BC Managed Care – PPO

## 2017-01-13 MED FILL — MYCOPHENOLATE SODIUM 180 MG PO TBEC: 180 mg | ORAL | 30 days supply | Qty: 180 | Fill #3 | Status: AC

## 2017-01-13 MED FILL — TACROLIMUS 1 MG PO CAP: 1 mg | ORAL | 30 days supply | Qty: 360 | Fill #3 | Status: AC

## 2017-02-09 ENCOUNTER — Encounter: Admit: 2017-02-09 | Discharge: 2017-02-09 | Payer: BC Managed Care – PPO

## 2017-02-09 MED FILL — OMEPRAZOLE 20 MG PO CPDR: 20 mg | ORAL | 90 days supply | Qty: 90 | Fill #2 | Status: AC

## 2017-02-09 MED FILL — MYCOPHENOLATE SODIUM 180 MG PO TBEC: 180 mg | ORAL | 30 days supply | Qty: 180 | Fill #4 | Status: AC

## 2017-02-09 MED FILL — TRAZODONE 100 MG PO TAB: 100 mg | ORAL | 90 days supply | Qty: 90 | Fill #2 | Status: AC

## 2017-02-09 MED FILL — TACROLIMUS 1 MG PO CAP: 1 mg | ORAL | 30 days supply | Qty: 360 | Fill #4 | Status: AC

## 2017-02-10 ENCOUNTER — Encounter: Admit: 2017-02-10 | Discharge: 2017-02-10 | Payer: BC Managed Care – PPO

## 2017-02-10 MED FILL — PROPRANOLOL 10 MG PO TAB: 10 mg | ORAL | 90 days supply | Qty: 360 | Fill #2 | Status: AC

## 2017-03-02 ENCOUNTER — Encounter: Admit: 2017-03-02 | Discharge: 2017-03-02 | Payer: BC Managed Care – PPO

## 2017-03-02 NOTE — Progress Notes
New standing and six-month lab orders faxed to pt's preferred lab.

## 2017-03-06 ENCOUNTER — Encounter: Admit: 2017-03-06 | Discharge: 2017-03-06 | Payer: BC Managed Care – PPO

## 2017-03-06 DIAGNOSIS — Z944 Liver transplant status: Principal | ICD-10-CM

## 2017-03-06 DIAGNOSIS — Z79899 Other long term (current) drug therapy: ICD-10-CM

## 2017-03-06 LAB — CBC AND DIFF
Lab: 0.1
Lab: 0.9
Lab: 12
Lab: 61
Lab: 86
Lab: 0
Lab: 0.2 — ABNORMAL LOW (ref 0.3–1.0)
Lab: 1.3
Lab: 15
Lab: 2.1
Lab: 203
Lab: 26
Lab: 29
Lab: 3.4 — ABNORMAL LOW (ref 4.5–10.5)
Lab: 34
Lab: 4.3
Lab: 45
Lab: 5.2
Lab: 6.7

## 2017-03-06 LAB — COMPREHENSIVE METABOLIC PANEL
Lab: 0.6 mg/dL
Lab: 104
Lab: 12
Lab: 12 — ABNORMAL LOW (ref 15–37)
Lab: 141 MMOL/L
Lab: 15
Lab: 27
Lab: 3.6
Lab: 4.3
Lab: 6.4
Lab: 64
Lab: 65
Lab: 78
Lab: 8.6
Lab: 84

## 2017-03-09 ENCOUNTER — Encounter: Admit: 2017-03-09 | Discharge: 2017-03-09 | Payer: BC Managed Care – PPO

## 2017-03-09 DIAGNOSIS — Z79899 Other long term (current) drug therapy: ICD-10-CM

## 2017-03-09 DIAGNOSIS — Z944 Liver transplant status: Principal | ICD-10-CM

## 2017-03-09 LAB — GGTP: Lab: 10

## 2017-03-10 ENCOUNTER — Encounter: Admit: 2017-03-10 | Discharge: 2017-03-10 | Payer: BC Managed Care – PPO

## 2017-03-10 DIAGNOSIS — Z944 Liver transplant status: Principal | ICD-10-CM

## 2017-03-10 DIAGNOSIS — Z79899 Other long term (current) drug therapy: ICD-10-CM

## 2017-03-10 LAB — URINALYSIS DIPSTICK REFLEX TO CULTURE
Lab: 0.2
Lab: NEGATIVE
Lab: NEGATIVE
Lab: NEGATIVE
Lab: NEGATIVE
Lab: 1
Lab: 6
Lab: NEGATIVE
Lab: NEGATIVE

## 2017-03-10 LAB — 25-OH VITAMIN D (D2 + D3): Lab: 21 % — ABNORMAL LOW (ref 30.0–100.0)

## 2017-03-10 LAB — LIPID PROFILE
Lab: 155 — ABNORMAL HIGH (ref 0–129)
Lab: 216 — ABNORMAL HIGH (ref 5–200)
Lab: 51
Lab: 52

## 2017-03-10 LAB — TACROLIMUS IMMUNOASSAY (FK506): Lab: 5.8

## 2017-03-12 ENCOUNTER — Encounter: Admit: 2017-03-12 | Discharge: 2017-03-12 | Payer: BC Managed Care – PPO

## 2017-03-13 ENCOUNTER — Encounter: Admit: 2017-03-13 | Discharge: 2017-03-13 | Payer: BC Managed Care – PPO

## 2017-03-13 MED ORDER — CHOLECALCIFEROL (VITAMIN D3) 50,000 UNIT PO CAP
50000 [IU] | ORAL_CAPSULE | ORAL | 0 refills | 84.00000 days | Status: AC
Start: 2017-03-13 — End: 2018-03-26

## 2017-03-20 ENCOUNTER — Encounter: Admit: 2017-03-20 | Discharge: 2017-03-20 | Payer: BC Managed Care – PPO

## 2017-03-20 MED FILL — MYCOPHENOLATE SODIUM 180 MG PO TBEC: 180 mg | ORAL | 30 days supply | Qty: 180 | Fill #5 | Status: AC

## 2017-03-20 MED FILL — TACROLIMUS 1 MG PO CAP: 1 mg | ORAL | 30 days supply | Qty: 360 | Fill #5 | Status: AC

## 2017-03-30 ENCOUNTER — Encounter: Admit: 2017-03-30 | Discharge: 2017-03-30 | Payer: BC Managed Care – PPO

## 2017-04-17 ENCOUNTER — Encounter: Admit: 2017-04-17 | Discharge: 2017-04-17 | Payer: BC Managed Care – PPO

## 2017-04-17 MED FILL — TACROLIMUS 1 MG PO CAP: 1 mg | ORAL | 30 days supply | Qty: 360 | Fill #6 | Status: AC

## 2017-04-17 MED FILL — MYCOPHENOLATE SODIUM 180 MG PO TBEC: 180 mg | ORAL | 30 days supply | Qty: 180 | Fill #6 | Status: AC

## 2017-05-04 ENCOUNTER — Encounter: Admit: 2017-05-04 | Discharge: 2017-05-04 | Payer: BC Managed Care – PPO

## 2017-05-05 MED ORDER — SERTRALINE 50 MG PO TAB
ORAL_TABLET | Freq: Every day | 1 refills
Start: 2017-05-05 — End: ?

## 2017-05-14 ENCOUNTER — Encounter: Admit: 2017-05-14 | Discharge: 2017-05-14 | Payer: BC Managed Care – PPO

## 2017-05-14 DIAGNOSIS — Z944 Liver transplant status: Principal | ICD-10-CM

## 2017-05-15 ENCOUNTER — Encounter: Admit: 2017-05-15 | Discharge: 2017-05-15 | Payer: BC Managed Care – PPO

## 2017-05-15 MED ORDER — OMEPRAZOLE 20 MG PO CPDR
20 mg | ORAL_CAPSULE | Freq: Every day | ORAL | 1 refills | Status: AC
Start: 2017-05-15 — End: 2018-02-09
  Filled 2017-05-18 (×2): qty 90, 90d supply, fill #1

## 2017-05-15 MED ORDER — MYCOPHENOLATE SODIUM 180 MG PO TBEC
180 mg | ORAL_TABLET | Freq: Two times a day (BID) | ORAL | 1 refills | Status: AC
Start: 2017-05-15 — End: 2017-12-01
  Filled 2017-05-15 (×2): qty 180, 30d supply, fill #1

## 2017-05-15 MED ORDER — PROPRANOLOL 10 MG PO TAB
20 mg | ORAL_TABLET | Freq: Two times a day (BID) | ORAL | 1 refills | Status: AC
Start: 2017-05-15 — End: 2018-02-09
  Filled 2017-05-18 (×2): qty 360, 90d supply, fill #1

## 2017-05-15 MED ORDER — TRAZODONE 100 MG PO TAB
100 mg | ORAL_TABLET | Freq: Every evening | ORAL | 1 refills | Status: AC
Start: 2017-05-15 — End: 2018-02-09
  Filled 2017-05-18 (×2): qty 90, 90d supply, fill #1

## 2017-05-15 MED ORDER — TACROLIMUS 1 MG PO CAP
2 mg | ORAL_CAPSULE | Freq: Two times a day (BID) | ORAL | 1 refills | 30.00000 days | Status: AC
Start: 2017-05-15 — End: 2017-12-01
  Filled 2017-05-15 (×2): qty 360, 30d supply, fill #1

## 2017-05-18 ENCOUNTER — Encounter: Admit: 2017-05-18 | Discharge: 2017-05-18 | Payer: BC Managed Care – PPO

## 2017-05-22 ENCOUNTER — Encounter: Admit: 2017-05-22 | Discharge: 2017-05-22 | Payer: BC Managed Care – PPO

## 2017-05-22 DIAGNOSIS — Z944 Liver transplant status: Principal | ICD-10-CM

## 2017-05-22 DIAGNOSIS — Z79899 Other long term (current) drug therapy: ICD-10-CM

## 2017-05-22 LAB — TACROLIMUS IMMUNOASSAY (FK506): Lab: 11 mg/dL — AB

## 2017-05-27 ENCOUNTER — Encounter: Admit: 2017-05-27 | Discharge: 2017-05-27 | Payer: BC Managed Care – PPO

## 2017-05-29 ENCOUNTER — Encounter: Admit: 2017-05-29 | Discharge: 2017-05-29 | Payer: BC Managed Care – PPO

## 2017-06-10 ENCOUNTER — Encounter: Admit: 2017-06-10 | Discharge: 2017-06-10 | Payer: BC Managed Care – PPO

## 2017-06-10 DIAGNOSIS — Z79899 Other long term (current) drug therapy: ICD-10-CM

## 2017-06-10 DIAGNOSIS — Z944 Liver transplant status: Principal | ICD-10-CM

## 2017-06-15 ENCOUNTER — Encounter: Admit: 2017-06-15 | Discharge: 2017-06-15 | Payer: BC Managed Care – PPO

## 2017-06-17 ENCOUNTER — Encounter: Admit: 2017-06-17 | Discharge: 2017-06-17 | Payer: BC Managed Care – PPO

## 2017-06-17 MED FILL — MYCOPHENOLATE SODIUM 180 MG PO TBEC: 180 mg | ORAL | 30 days supply | Qty: 180 | Fill #2 | Status: AC

## 2017-06-17 MED FILL — TACROLIMUS 1 MG PO CAP: 1 mg | ORAL | 30 days supply | Qty: 360 | Fill #2 | Status: AC

## 2017-07-07 ENCOUNTER — Encounter: Admit: 2017-07-07 | Discharge: 2017-07-07 | Payer: BC Managed Care – PPO

## 2017-07-17 ENCOUNTER — Encounter: Admit: 2017-07-17 | Discharge: 2017-07-17 | Payer: BC Managed Care – PPO

## 2017-07-23 ENCOUNTER — Encounter: Admit: 2017-07-23 | Discharge: 2017-07-23 | Payer: BC Managed Care – PPO

## 2017-07-23 MED ORDER — FUROSEMIDE 20 MG PO TAB
ORAL_TABLET | Freq: Every day | ORAL | 1 refills | 90.00000 days | Status: AC | PRN
Start: 2017-07-23 — End: ?

## 2017-07-29 ENCOUNTER — Encounter: Admit: 2017-07-29 | Discharge: 2017-07-29 | Payer: BC Managed Care – PPO

## 2017-07-29 MED FILL — MYCOPHENOLATE SODIUM 180 MG PO TBEC: 180 mg | ORAL | 30 days supply | Qty: 180 | Fill #3 | Status: AC

## 2017-07-29 MED FILL — TACROLIMUS 1 MG PO CAP: 1 mg | ORAL | 30 days supply | Qty: 360 | Fill #3 | Status: AC

## 2017-07-31 ENCOUNTER — Encounter: Admit: 2017-07-31 | Discharge: 2017-07-31 | Payer: BC Managed Care – PPO

## 2017-08-25 ENCOUNTER — Encounter: Admit: 2017-08-25 | Discharge: 2017-08-25 | Payer: BC Managed Care – PPO

## 2017-08-28 ENCOUNTER — Encounter: Admit: 2017-08-28 | Discharge: 2017-08-28 | Payer: BC Managed Care – PPO

## 2017-09-14 ENCOUNTER — Encounter: Admit: 2017-09-14 | Discharge: 2017-09-14 | Payer: BC Managed Care – PPO

## 2017-09-15 ENCOUNTER — Encounter: Admit: 2017-09-15 | Discharge: 2017-09-15 | Payer: BC Managed Care – PPO

## 2017-09-15 MED ORDER — NALTREXONE 50 MG PO TAB
50 mg | ORAL_TABLET | Freq: Every day | ORAL | 5 refills | Status: AC
Start: 2017-09-15 — End: ?
  Filled 2017-09-16 (×2): qty 30, 30d supply, fill #1

## 2017-09-15 MED FILL — MYCOPHENOLATE SODIUM 180 MG PO TBEC: 180 mg | ORAL | 30 days supply | Qty: 180 | Fill #4 | Status: AC

## 2017-09-15 MED FILL — TRAZODONE 100 MG PO TAB: 100 mg | ORAL | 90 days supply | Qty: 90 | Fill #2 | Status: AC

## 2017-09-15 MED FILL — PROPRANOLOL 10 MG PO TAB: 10 mg | ORAL | 90 days supply | Qty: 360 | Fill #2 | Status: AC

## 2017-09-15 MED FILL — TACROLIMUS 1 MG PO CAP: 1 mg | ORAL | 30 days supply | Qty: 360 | Fill #4 | Status: AC

## 2017-09-15 MED FILL — OMEPRAZOLE 20 MG PO CPDR: 20 mg | ORAL | 90 days supply | Qty: 90 | Fill #2 | Status: AC

## 2017-09-16 ENCOUNTER — Encounter: Admit: 2017-09-16 | Discharge: 2017-09-16 | Payer: BC Managed Care – PPO

## 2017-09-21 ENCOUNTER — Encounter: Admit: 2017-09-21 | Discharge: 2017-09-21 | Payer: BC Managed Care – PPO

## 2017-09-21 MED ORDER — SERTRALINE 50 MG PO TAB
50 mg | ORAL_TABLET | Freq: Every day | ORAL | 1 refills | Status: AC
Start: 2017-09-21 — End: 2018-06-07

## 2017-10-08 ENCOUNTER — Encounter: Admit: 2017-10-08 | Discharge: 2017-10-08 | Payer: BC Managed Care – PPO

## 2017-10-08 MED FILL — TACROLIMUS 1 MG PO CAP: 1 mg | ORAL | 30 days supply | Qty: 360 | Fill #5 | Status: AC

## 2017-10-08 MED FILL — MYCOPHENOLATE SODIUM 180 MG PO TBEC: 180 mg | ORAL | 30 days supply | Qty: 180 | Fill #5 | Status: AC

## 2017-11-09 ENCOUNTER — Encounter: Admit: 2017-11-09 | Discharge: 2017-11-09 | Payer: BC Managed Care – PPO

## 2017-11-09 MED FILL — TACROLIMUS 1 MG PO CAP: 1 mg | ORAL | 30 days supply | Qty: 360 | Fill #6 | Status: AC

## 2017-11-09 MED FILL — MYCOPHENOLATE SODIUM 180 MG PO TBEC: 180 mg | ORAL | 30 days supply | Qty: 180 | Fill #6 | Status: AC

## 2017-11-11 ENCOUNTER — Encounter: Admit: 2017-11-11 | Discharge: 2017-11-11 | Payer: BC Managed Care – PPO

## 2017-11-24 ENCOUNTER — Ambulatory Visit: Admit: 2017-11-24 | Discharge: 2017-11-24 | Payer: BC Managed Care – PPO

## 2017-11-24 ENCOUNTER — Encounter: Admit: 2017-11-24 | Discharge: 2017-11-24 | Payer: BC Managed Care – PPO

## 2017-11-24 ENCOUNTER — Ambulatory Visit: Admit: 2017-11-24 | Discharge: 2017-11-24 | Payer: MEDICARE

## 2017-11-24 ENCOUNTER — Ambulatory Visit: Admit: 2017-11-24 | Discharge: 2017-11-25 | Payer: BC Managed Care – PPO

## 2017-11-24 DIAGNOSIS — N289 Disorder of kidney and ureter, unspecified: ICD-10-CM

## 2017-11-24 DIAGNOSIS — Z944 Liver transplant status: Principal | ICD-10-CM

## 2017-11-24 DIAGNOSIS — Z940 Kidney transplant status: ICD-10-CM

## 2017-11-24 DIAGNOSIS — K7290 Hepatic failure, unspecified without coma: Principal | ICD-10-CM

## 2017-11-24 DIAGNOSIS — I10 Essential (primary) hypertension: ICD-10-CM

## 2017-11-24 DIAGNOSIS — D539 Nutritional anemia, unspecified: ICD-10-CM

## 2017-11-24 DIAGNOSIS — Z0000 Encounter for general adult medical examination without abnormal findings: ICD-10-CM

## 2017-11-24 DIAGNOSIS — T7840XA Allergy, unspecified, initial encounter: ICD-10-CM

## 2017-11-24 DIAGNOSIS — F1010 Alcohol abuse, uncomplicated: ICD-10-CM

## 2017-11-24 DIAGNOSIS — D259 Leiomyoma of uterus, unspecified: ICD-10-CM

## 2017-11-24 DIAGNOSIS — Z79899 Other long term (current) drug therapy: ICD-10-CM

## 2017-11-24 DIAGNOSIS — I499 Cardiac arrhythmia, unspecified: ICD-10-CM

## 2017-11-24 DIAGNOSIS — F419 Anxiety disorder, unspecified: ICD-10-CM

## 2017-11-24 DIAGNOSIS — E785 Hyperlipidemia, unspecified: ICD-10-CM

## 2017-11-24 DIAGNOSIS — K5792 Diverticulitis of intestine, part unspecified, without perforation or abscess without bleeding: ICD-10-CM

## 2017-11-24 DIAGNOSIS — Z780 Asymptomatic menopausal state: ICD-10-CM

## 2017-11-24 DIAGNOSIS — Z9289 Personal history of other medical treatment: ICD-10-CM

## 2017-11-24 DIAGNOSIS — E669 Obesity, unspecified: ICD-10-CM

## 2017-11-24 DIAGNOSIS — G4730 Sleep apnea, unspecified: ICD-10-CM

## 2017-11-24 DIAGNOSIS — R609 Edema, unspecified: ICD-10-CM

## 2017-11-24 DIAGNOSIS — R748 Abnormal levels of other serum enzymes: ICD-10-CM

## 2017-11-24 LAB — CBC AND DIFF
Lab: 0 % (ref >60)
Lab: 0 10*3/uL (ref 0–0.20)
Lab: 0.2 10*3/uL (ref 0–0.45)
Lab: 0.2 10*3/uL (ref 0–0.80)
Lab: 0.9 10*3/uL — ABNORMAL LOW (ref 1.0–4.8)
Lab: 14 g/dL (ref >60)
Lab: 15 % — ABNORMAL HIGH (ref 11–15)
Lab: 181 10*3/uL — ABNORMAL HIGH (ref 150–400)
Lab: 2.2 10*3/uL (ref >60)
Lab: 26 % (ref 24–44)
Lab: 3.5 10*3/uL — ABNORMAL LOW (ref 4.5–11.0)
Lab: 32 pg (ref 26–34)
Lab: 34 g/dL (ref 32.0–36.0)
Lab: 4.3 M/UL (ref 4.0–5.0)
Lab: 40 % (ref >60)
Lab: 6 % — ABNORMAL HIGH (ref 0–5)
Lab: 61 % (ref 41–77)
Lab: 7 % — ABNORMAL LOW (ref 4–12)
Lab: 7.8 FL (ref 7–11)
Lab: 94 FL (ref 80–100)

## 2017-11-24 LAB — 25-OH VITAMIN D (D2 + D3): Lab: 32 ng/mL (ref 30–80)

## 2017-11-24 LAB — COMPREHENSIVE METABOLIC PANEL: Lab: 139 MMOL/L — ABNORMAL HIGH (ref 137–147)

## 2017-11-25 ENCOUNTER — Encounter: Admit: 2017-11-25 | Discharge: 2017-11-25 | Payer: BC Managed Care – PPO

## 2017-11-27 ENCOUNTER — Encounter: Admit: 2017-11-27 | Discharge: 2017-11-27 | Payer: BC Managed Care – PPO

## 2017-12-01 ENCOUNTER — Encounter: Admit: 2017-12-01 | Discharge: 2017-12-01 | Payer: BC Managed Care – PPO

## 2017-12-01 DIAGNOSIS — Z944 Liver transplant status: Principal | ICD-10-CM

## 2017-12-01 MED ORDER — MYCOPHENOLATE SODIUM 180 MG PO TBEC
180 mg | ORAL_TABLET | Freq: Two times a day (BID) | ORAL | 1 refills | Status: AC
Start: 2017-12-01 — End: 2018-08-11
  Filled 2017-12-04 (×2): qty 180, 30d supply, fill #1

## 2017-12-01 MED ORDER — TACROLIMUS 1 MG PO CAP
2 mg | ORAL_CAPSULE | Freq: Two times a day (BID) | ORAL | 1 refills | 30.00000 days | Status: AC
Start: 2017-12-01 — End: 2018-08-11
  Filled 2017-12-04 (×2): qty 360, 30d supply, fill #1

## 2017-12-04 ENCOUNTER — Encounter: Admit: 2017-12-04 | Discharge: 2017-12-04 | Payer: BC Managed Care – PPO

## 2017-12-08 ENCOUNTER — Encounter: Admit: 2017-12-08 | Discharge: 2017-12-08 | Payer: BC Managed Care – PPO

## 2017-12-17 ENCOUNTER — Encounter: Admit: 2017-12-17 | Discharge: 2017-12-17 | Payer: BC Managed Care – PPO

## 2018-01-12 ENCOUNTER — Encounter: Admit: 2018-01-12 | Discharge: 2018-01-12 | Payer: BC Managed Care – PPO

## 2018-01-12 ENCOUNTER — Ambulatory Visit: Admit: 2018-01-12 | Discharge: 2018-01-12 | Payer: BC Managed Care – PPO

## 2018-01-12 DIAGNOSIS — Z0000 Encounter for general adult medical examination without abnormal findings: ICD-10-CM

## 2018-01-13 ENCOUNTER — Encounter: Admit: 2018-01-13 | Discharge: 2018-01-13 | Payer: BC Managed Care – PPO

## 2018-02-09 ENCOUNTER — Encounter: Admit: 2018-02-09 | Discharge: 2018-02-09 | Payer: BC Managed Care – PPO

## 2018-02-09 MED ORDER — TRAZODONE 100 MG PO TAB
100 mg | ORAL_TABLET | Freq: Every evening | ORAL | 1 refills | Status: AC
Start: 2018-02-09 — End: ?
  Filled 2018-02-09 (×2): qty 90, 90d supply, fill #1

## 2018-02-09 MED ORDER — OMEPRAZOLE 20 MG PO CPDR
20 mg | ORAL_CAPSULE | Freq: Every day | ORAL | 1 refills | Status: AC
Start: 2018-02-09 — End: 2018-10-22
  Filled 2018-02-09 (×2): qty 90, 90d supply, fill #1

## 2018-02-09 MED ORDER — PROPRANOLOL 10 MG PO TAB
20 mg | ORAL_TABLET | Freq: Two times a day (BID) | ORAL | 1 refills | Status: AC
Start: 2018-02-09 — End: 2019-04-22
  Filled 2018-02-10 (×2): qty 360, 90d supply, fill #1

## 2018-02-09 MED FILL — MYCOPHENOLATE SODIUM 180 MG PO TBEC: 180 mg | ORAL | 30 days supply | Qty: 180 | Fill #2 | Status: AC

## 2018-02-09 MED FILL — TACROLIMUS 1 MG PO CAP: 1 mg | ORAL | 30 days supply | Qty: 360 | Fill #2 | Status: AC

## 2018-02-10 ENCOUNTER — Encounter: Admit: 2018-02-10 | Discharge: 2018-02-10 | Payer: BC Managed Care – PPO

## 2018-02-12 ENCOUNTER — Encounter: Admit: 2018-02-12 | Discharge: 2018-02-12 | Payer: BC Managed Care – PPO

## 2018-03-10 ENCOUNTER — Encounter: Admit: 2018-03-10 | Discharge: 2018-03-10 | Payer: BC Managed Care – PPO

## 2018-03-10 MED FILL — NALTREXONE 50 MG PO TAB: 50 mg | ORAL | 30 days supply | Qty: 30 | Fill #2 | Status: AC

## 2018-03-10 MED FILL — MYCOPHENOLATE SODIUM 180 MG PO TBEC: 180 mg | ORAL | 30 days supply | Qty: 180 | Fill #3 | Status: AC

## 2018-03-10 MED FILL — TACROLIMUS 1 MG PO CAP: 1 mg | ORAL | 30 days supply | Qty: 360 | Fill #3 | Status: AC

## 2018-03-26 MED ORDER — PEG-ELECTROLYTE SOLN 420 GRAM PO SOLR
0 refills | Status: AC
Start: 2018-03-26 — End: ?

## 2018-04-05 ENCOUNTER — Encounter: Admit: 2018-04-05 | Discharge: 2018-04-05 | Payer: BC Managed Care – PPO

## 2018-04-19 ENCOUNTER — Encounter: Admit: 2018-04-19 | Discharge: 2018-04-19 | Payer: BC Managed Care – PPO

## 2018-04-20 ENCOUNTER — Encounter: Admit: 2018-04-20 | Discharge: 2018-04-20 | Payer: BC Managed Care – PPO

## 2018-04-20 MED FILL — TRAZODONE 100 MG PO TAB: 100 mg | ORAL | 90 days supply | Qty: 90 | Fill #2 | Status: AC

## 2018-04-20 MED FILL — MYCOPHENOLATE SODIUM 180 MG PO TBEC: 180 mg | ORAL | 30 days supply | Qty: 180 | Fill #4 | Status: AC

## 2018-04-20 MED FILL — TACROLIMUS 1 MG PO CAP: 1 mg | ORAL | 30 days supply | Qty: 360 | Fill #4 | Status: AC

## 2018-04-21 ENCOUNTER — Encounter: Admit: 2018-04-21 | Discharge: 2018-04-21 | Payer: BC Managed Care – PPO

## 2018-04-21 MED FILL — PROPRANOLOL 10 MG PO TAB: 10 mg | ORAL | 90 days supply | Qty: 360 | Fill #2 | Status: AC

## 2018-05-14 ENCOUNTER — Encounter: Admit: 2018-05-14 | Discharge: 2018-05-14 | Payer: BC Managed Care – PPO

## 2018-05-14 MED FILL — MYCOPHENOLATE SODIUM 180 MG PO TBEC: 180 mg | ORAL | 30 days supply | Qty: 180 | Fill #5 | Status: AC

## 2018-05-14 MED FILL — TACROLIMUS 1 MG PO CAP: 1 mg | ORAL | 30 days supply | Qty: 360 | Fill #5 | Status: AC

## 2018-06-07 ENCOUNTER — Encounter: Admit: 2018-06-07 | Discharge: 2018-06-07 | Payer: BC Managed Care – PPO

## 2018-06-07 MED ORDER — SERTRALINE 50 MG PO TAB
ORAL_TABLET | Freq: Every day | 0 refills | Status: AC
Start: 2018-06-07 — End: ?

## 2018-06-07 MED FILL — NALTREXONE 50 MG PO TAB: 50 mg | ORAL | 30 days supply | Qty: 30 | Fill #3 | Status: AC

## 2018-06-07 MED FILL — OMEPRAZOLE 20 MG PO CPDR: 20 mg | ORAL | 90 days supply | Qty: 90 | Fill #2 | Status: AC

## 2018-06-10 ENCOUNTER — Encounter: Admit: 2018-06-10 | Discharge: 2018-06-10 | Payer: BC Managed Care – PPO

## 2018-06-10 NOTE — Telephone Encounter
Left voicemail requesting call back.  

## 2018-07-02 ENCOUNTER — Encounter: Admit: 2018-07-02 | Discharge: 2018-07-02 | Payer: BC Managed Care – PPO

## 2018-07-19 ENCOUNTER — Encounter: Admit: 2018-07-19 | Discharge: 2018-07-19 | Payer: BC Managed Care – PPO

## 2018-07-19 NOTE — Progress Notes
Attempted to request lab results from [facility].  Lab staff cannot release results, no answer in Medical Records dept.  NC will attempt at later date.

## 2018-07-21 ENCOUNTER — Encounter: Admit: 2018-07-21 | Discharge: 2018-07-21 | Payer: BC Managed Care – PPO

## 2018-07-21 MED FILL — TACROLIMUS 1 MG PO CAP: 1 mg | ORAL | 30 days supply | Qty: 360 | Fill #6 | Status: AC

## 2018-07-21 MED FILL — MYCOPHENOLATE SODIUM 180 MG PO TBEC: 180 mg | ORAL | 30 days supply | Qty: 180 | Fill #6 | Status: AC

## 2018-07-22 ENCOUNTER — Encounter: Admit: 2018-07-22 | Discharge: 2018-07-22 | Payer: BC Managed Care – PPO

## 2018-08-10 ENCOUNTER — Encounter: Admit: 2018-08-10 | Discharge: 2018-08-10

## 2018-08-10 DIAGNOSIS — Z944 Liver transplant status: Secondary | ICD-10-CM

## 2018-08-11 ENCOUNTER — Encounter: Admit: 2018-08-11 | Discharge: 2018-08-11

## 2018-08-11 MED ORDER — MYCOPHENOLATE SODIUM 180 MG PO TBEC
180 mg | ORAL_TABLET | Freq: Two times a day (BID) | ORAL | 1 refills | Status: DC
Start: 2018-08-11 — End: 2019-06-20
  Filled 2018-09-09: qty 180, 30d supply, fill #1

## 2018-08-11 MED ORDER — TACROLIMUS 1 MG PO CAP
2 mg | ORAL_CAPSULE | Freq: Two times a day (BID) | ORAL | 1 refills | 30.00000 days | Status: DC
Start: 2018-08-11 — End: 2019-05-11
  Filled 2018-09-09: qty 360, 30d supply, fill #1

## 2018-09-09 ENCOUNTER — Encounter: Admit: 2018-09-09 | Discharge: 2018-09-09

## 2018-10-06 ENCOUNTER — Encounter: Admit: 2018-10-06 | Discharge: 2018-10-06

## 2018-10-19 ENCOUNTER — Encounter: Admit: 2018-10-19 | Discharge: 2018-10-19 | Payer: BC Managed Care – PPO

## 2018-10-19 MED FILL — TACROLIMUS 1 MG PO CAP: 1 mg | ORAL | 30 days supply | Qty: 360 | Fill #2 | Status: AC

## 2018-10-19 MED FILL — MYCOPHENOLATE SODIUM 180 MG PO TBEC: 180 mg | ORAL | 30 days supply | Qty: 180 | Fill #2 | Status: AC

## 2018-10-22 ENCOUNTER — Encounter: Admit: 2018-10-22 | Discharge: 2018-10-22

## 2018-10-22 MED ORDER — OMEPRAZOLE 20 MG PO CPDR
20 mg | ORAL_CAPSULE | Freq: Every day | ORAL | 1 refills | Status: AC
Start: 2018-10-22 — End: ?
  Filled 2018-10-24: qty 90, 90d supply, fill #1

## 2018-11-22 ENCOUNTER — Encounter: Admit: 2018-11-22 | Discharge: 2018-11-22 | Payer: BC Managed Care – PPO

## 2018-11-22 MED FILL — MYCOPHENOLATE SODIUM 180 MG PO TBEC: 180 mg | ORAL | 30 days supply | Qty: 180 | Fill #3 | Status: AC

## 2018-11-22 MED FILL — TACROLIMUS 1 MG PO CAP: 1 mg | ORAL | 30 days supply | Qty: 360 | Fill #3 | Status: AC

## 2018-12-06 MED FILL — TACROLIMUS 1 MG PO CAP: 1 mg | ORAL | 7 days supply | Qty: 360 | Fill #4 | Status: AC

## 2018-12-06 MED FILL — MYCOPHENOLATE SODIUM 180 MG PO TBEC: 180 mg | ORAL | 7 days supply | Qty: 180 | Fill #4 | Status: AC

## 2018-12-07 ENCOUNTER — Encounter: Admit: 2018-12-07 | Discharge: 2018-12-07 | Payer: BC Managed Care – PPO

## 2018-12-28 ENCOUNTER — Encounter: Admit: 2018-12-28 | Discharge: 2018-12-28 | Payer: BC Managed Care – PPO

## 2018-12-28 MED FILL — TACROLIMUS 1 MG PO CAP: 1 mg | ORAL | 30 days supply | Qty: 360 | Fill #5 | Status: AC

## 2018-12-28 MED FILL — MYCOPHENOLATE SODIUM 180 MG PO TBEC: 180 mg | ORAL | 30 days supply | Qty: 180 | Fill #5 | Status: AC

## 2019-01-12 ENCOUNTER — Encounter: Admit: 2019-01-12 | Discharge: 2019-01-12 | Payer: BC Managed Care – PPO

## 2019-01-19 ENCOUNTER — Encounter: Admit: 2019-01-19 | Discharge: 2019-01-19 | Payer: BC Managed Care – PPO

## 2019-03-14 ENCOUNTER — Encounter: Admit: 2019-03-14 | Discharge: 2019-03-14 | Payer: BC Managed Care – PPO

## 2019-03-14 NOTE — Telephone Encounter
LVM requesting call back regarding overdue labs & f/u. Will send letter to pt's home address.

## 2019-04-21 ENCOUNTER — Encounter: Admit: 2019-04-21 | Discharge: 2019-04-21 | Payer: BC Managed Care – PPO

## 2019-04-21 MED FILL — OMEPRAZOLE 20 MG PO CPDR: 20 mg | ORAL | 90 days supply | Qty: 90 | Fill #2 | Status: AC

## 2019-04-21 MED FILL — TACROLIMUS 1 MG PO CAP: 1 mg | ORAL | 23 days supply | Qty: 360 | Fill #7 | Status: AC

## 2019-04-21 MED FILL — MYCOPHENOLATE SODIUM 180 MG PO TBEC: 180 mg | ORAL | 30 days supply | Qty: 180 | Fill #6 | Status: AC

## 2019-04-22 ENCOUNTER — Encounter: Admit: 2019-04-22 | Discharge: 2019-04-22 | Payer: BC Managed Care – PPO

## 2019-04-22 MED ORDER — PROPRANOLOL 10 MG PO TAB
20 mg | ORAL_TABLET | Freq: Two times a day (BID) | ORAL | 1 refills | Status: CN
Start: 2019-04-22 — End: ?

## 2019-04-27 ENCOUNTER — Encounter: Admit: 2019-04-27 | Discharge: 2019-04-27 | Payer: BC Managed Care – PPO

## 2019-04-27 DIAGNOSIS — E559 Vitamin D deficiency, unspecified: Secondary | ICD-10-CM

## 2019-05-11 ENCOUNTER — Encounter: Admit: 2019-05-11 | Discharge: 2019-05-11 | Payer: BC Managed Care – PPO

## 2019-05-11 DIAGNOSIS — Z944 Liver transplant status: Secondary | ICD-10-CM

## 2019-05-11 MED ORDER — TACROLIMUS 1 MG PO CAP
2 mg | ORAL_CAPSULE | Freq: Two times a day (BID) | ORAL | 1 refills | 30.00000 days | Status: AC
Start: 2019-05-11 — End: ?
  Filled 2019-05-31: qty 120, 30d supply, fill #1

## 2019-05-16 ENCOUNTER — Encounter: Admit: 2019-05-16 | Discharge: 2019-05-16 | Payer: BC Managed Care – PPO

## 2019-05-16 NOTE — Telephone Encounter
Spoke with pt; reminded her that labs are due & orders were faxed to [facility]. Pt states that she will complete these this week. Confirmed plans for [place] same day as appt in April.

## 2019-05-31 ENCOUNTER — Encounter: Admit: 2019-05-31 | Discharge: 2019-05-31 | Payer: BC Managed Care – PPO

## 2019-05-31 MED FILL — MYCOPHENOLATE SODIUM 180 MG PO TBEC: 180 mg | ORAL | 23 days supply | Qty: 180 | Fill #7 | Status: AC

## 2019-06-01 ENCOUNTER — Encounter: Admit: 2019-06-01 | Discharge: 2019-06-01 | Payer: BC Managed Care – PPO

## 2019-06-01 NOTE — Telephone Encounter
Called pt to confirm whether or not labs were completed yet. Pt states that she is going to [facility] to complete labs today. Confirmed appt details for 4/15.

## 2019-06-02 ENCOUNTER — Encounter: Admit: 2019-06-02 | Discharge: 2019-06-02 | Payer: BC Managed Care – PPO

## 2019-06-02 DIAGNOSIS — Z944 Liver transplant status: Secondary | ICD-10-CM

## 2019-06-02 LAB — COMPREHENSIVE METABOLIC PANEL
Lab: >60
Lab: >60
Lab: 0.3 mg/dL
Lab: 0.9 mg/dL
Lab: 107 mg/dL — ABNORMAL HIGH (ref 74–106)
Lab: 107 mmol/L
Lab: 138 mmol/L
Lab: 16
Lab: 19 mg/dL — ABNORMAL HIGH (ref 7–17)
Lab: 28 mmol/L
Lab: 4 g/dL
Lab: 4.5 mmol/L
Lab: 59 U/L
Lab: 6.4 g/dL
Lab: 7 — ABNORMAL LOW (ref 9–52)
Lab: 9.6 mg/dL

## 2019-06-02 LAB — CBC AND DIFF
Lab: 1.6
Lab: 2.8
Lab: 3.4 — ABNORMAL LOW (ref 4.5–10.5)

## 2019-06-03 ENCOUNTER — Encounter: Admit: 2019-06-03 | Discharge: 2019-06-03 | Payer: BC Managed Care – PPO

## 2019-06-03 DIAGNOSIS — Z944 Liver transplant status: Secondary | ICD-10-CM

## 2019-06-08 ENCOUNTER — Encounter: Admit: 2019-06-08 | Discharge: 2019-06-08 | Payer: BC Managed Care – PPO

## 2019-06-08 DIAGNOSIS — Z944 Liver transplant status: Secondary | ICD-10-CM

## 2019-06-08 LAB — TACROLIMUS IMMUNOASSAY (FK506): Lab: 4

## 2019-06-09 ENCOUNTER — Encounter: Admit: 2019-06-09 | Discharge: 2019-06-09 | Payer: BC Managed Care – PPO

## 2019-06-09 LAB — 25-OH VITAMIN D (D2 + D3): Lab: 30 ng/dL

## 2019-06-15 ENCOUNTER — Encounter: Admit: 2019-06-15 | Discharge: 2019-06-15 | Payer: BC Managed Care – PPO

## 2019-06-16 ENCOUNTER — Encounter: Admit: 2019-06-16 | Discharge: 2019-06-16 | Payer: BC Managed Care – PPO

## 2019-06-16 NOTE — Progress Notes
Reviewed labs from 06/01/19  Liver team managing IS     06/01/2019 12:32   Hemoglobin 12.8   White Blood Cells 3.4 (L)   Potassium 4.5   Creatinine 0.90   Glucose 107 (H)   Tacrolimus Immunoassay 4.0   Vitamin D(25-OH)Total 30

## 2019-06-20 MED ORDER — MYCOPHENOLATE SODIUM 180 MG PO TBEC
180 mg | ORAL_TABLET | Freq: Two times a day (BID) | ORAL | 1 refills | Status: AC
Start: 2019-06-20 — End: ?
  Filled 2019-07-19: qty 180, 30d supply, fill #1

## 2019-07-18 ENCOUNTER — Encounter: Admit: 2019-07-18 | Discharge: 2019-07-18 | Payer: BC Managed Care – PPO

## 2019-07-18 NOTE — Telephone Encounter
LVM asking pt to CB to [facility] missed appts from April w/ Dr. Constance Goltz.

## 2019-07-19 ENCOUNTER — Encounter: Admit: 2019-07-19 | Discharge: 2019-07-19 | Payer: BC Managed Care – PPO

## 2019-07-19 DIAGNOSIS — E559 Vitamin D deficiency, unspecified: Secondary | ICD-10-CM

## 2019-07-19 MED FILL — TACROLIMUS 1 MG PO CAP: 1 mg | ORAL | 30 days supply | Qty: 360 | Fill #2 | Status: AC

## 2019-07-19 NOTE — Telephone Encounter
Stated the diagnosis code for the Vitamin D is stating medically not necessary.  Will need to resend with corrected diagnosis code.

## 2019-07-19 NOTE — Telephone Encounter
Called again to check on dx code.  Please return her call.

## 2019-08-30 ENCOUNTER — Encounter: Admit: 2019-08-30 | Discharge: 2019-08-30 | Payer: BC Managed Care – PPO

## 2019-08-30 MED FILL — TACROLIMUS 1 MG PO CAP: 1 mg | ORAL | 30 days supply | Qty: 120 | Fill #3 | Status: AC

## 2019-08-30 MED FILL — MYCOPHENOLATE SODIUM 180 MG PO TBEC: 180 mg | ORAL | 30 days supply | Qty: 180 | Fill #2 | Status: AC

## 2019-09-02 ENCOUNTER — Encounter: Admit: 2019-09-02 | Discharge: 2019-09-02 | Payer: BC Managed Care – PPO

## 2019-09-02 NOTE — Telephone Encounter
LVM reminding pt to get labs soon and requested call back to schedule [place] & appt with our office.

## 2019-09-08 ENCOUNTER — Encounter: Admit: 2019-09-08 | Discharge: 2019-09-08 | Payer: BC Managed Care – PPO

## 2019-09-22 ENCOUNTER — Encounter: Admit: 2019-09-22 | Discharge: 2019-09-22 | Payer: BC Managed Care – PPO

## 2019-09-22 MED FILL — MYCOPHENOLATE SODIUM 180 MG PO TBEC: 180 mg | ORAL | 30 days supply | Qty: 60 | Fill #3 | Status: AC

## 2019-09-22 MED FILL — TACROLIMUS 1 MG PO CAP: 1 mg | ORAL | 30 days supply | Qty: 360 | Fill #4 | Status: AC

## 2019-10-24 ENCOUNTER — Encounter: Admit: 2019-10-24 | Discharge: 2019-10-24 | Payer: BC Managed Care – PPO

## 2019-10-24 NOTE — Telephone Encounter
Call from Audrey Oliver asking to schedule an appointment.  Please return her call

## 2019-11-08 ENCOUNTER — Encounter: Admit: 2019-11-08 | Discharge: 2019-11-08 | Payer: BC Managed Care – PPO

## 2019-11-08 MED FILL — MYCOPHENOLATE SODIUM 180 MG PO TBEC: 180 mg | ORAL | 30 days supply | Qty: 180 | Fill #4 | Status: AC

## 2019-11-08 MED FILL — TACROLIMUS 1 MG PO CAP: 1 mg | ORAL | 30 days supply | Qty: 360 | Fill #5 | Status: AC

## 2019-12-01 ENCOUNTER — Encounter: Admit: 2019-12-01 | Discharge: 2019-12-01 | Payer: BC Managed Care – PPO

## 2019-12-01 NOTE — Telephone Encounter
LVM advising that pt is overdue for labs. Requested call back to coordinate plan - lab orders faxed to [facility] [phone].     Pt also needs [place]; will confirm with pt if she prefers to complete this at [facility] or if she wants to coordinate it with Nov appointment.

## 2019-12-05 ENCOUNTER — Encounter: Admit: 2019-12-05 | Discharge: 2019-12-05 | Payer: BC Managed Care – PPO

## 2019-12-05 MED FILL — MYCOPHENOLATE SODIUM 180 MG PO TBEC: 180 mg | ORAL | 30 days supply | Qty: 60 | Fill #5 | Status: AC

## 2019-12-05 MED FILL — TACROLIMUS 1 MG PO CAP: 1 mg | ORAL | 30 days supply | Qty: 120 | Fill #6 | Status: AC

## 2019-12-22 ENCOUNTER — Encounter: Admit: 2019-12-22 | Discharge: 2019-12-22 | Payer: BC Managed Care – PPO

## 2019-12-22 NOTE — Telephone Encounter
LVM requesting call back to confirm if/when labs were completed - no results received from [facility] Also advised that [place] was scheduled same day as clinic appt on 11/16. Provided radiology scheduling phone number in case pt needs to change that appt.

## 2019-12-26 ENCOUNTER — Encounter: Admit: 2019-12-26 | Discharge: 2019-12-26 | Payer: BC Managed Care – PPO

## 2019-12-26 DIAGNOSIS — Z944 Liver transplant status: Secondary | ICD-10-CM

## 2019-12-26 MED ORDER — TACROLIMUS 1 MG PO CAP
2 mg | ORAL_CAPSULE | Freq: Two times a day (BID) | ORAL | 1 refills
Start: 2019-12-26 — End: ?

## 2019-12-28 ENCOUNTER — Encounter: Admit: 2019-12-28 | Discharge: 2019-12-28 | Payer: BC Managed Care – PPO

## 2019-12-29 ENCOUNTER — Encounter: Admit: 2019-12-29 | Discharge: 2019-12-29 | Payer: BC Managed Care – PPO

## 2019-12-30 ENCOUNTER — Encounter: Admit: 2019-12-30 | Discharge: 2019-12-30 | Payer: BC Managed Care – PPO

## 2019-12-30 MED ORDER — OMEPRAZOLE 20 MG PO CPDR
20 mg | ORAL_CAPSULE | Freq: Every day | ORAL | 1 refills
Start: 2019-12-30 — End: ?

## 2020-01-02 ENCOUNTER — Encounter: Admit: 2020-01-02 | Discharge: 2020-01-02 | Payer: BC Managed Care – PPO

## 2020-01-02 MED FILL — OMEPRAZOLE 20 MG PO CPDR: 20 mg | ORAL | 90 days supply | Qty: 90 | Fill #1 | Status: AC

## 2020-01-09 ENCOUNTER — Encounter: Admit: 2020-01-09 | Discharge: 2020-01-09 | Payer: BC Managed Care – PPO

## 2020-01-09 NOTE — Telephone Encounter
LVM requesting call back to confirm if/when labs were completed & to confirm [place] appt details before clinic appt next week on 11/16.

## 2020-01-12 ENCOUNTER — Encounter: Admit: 2020-01-12 | Discharge: 2020-01-12 | Payer: BC Managed Care – PPO

## 2020-01-12 NOTE — Telephone Encounter
Pt called stating that she has not had the chance to complete labs yet. Pt asks about moving appt to Nov 30th; advised that there are no openings that day. Pt asks if there are any other openings in Nov; she states that she will make it work on 11/16 if nothing else is available. Advised that I will check with scheduling team, but it does not appear that anything is open and pt should plan on 11/16. Pt v/u & states that she will get labs done tomorrow at [facility].     Confirmed with scheduling team that there are no other openings in Nov or Dec.

## 2020-01-17 ENCOUNTER — Encounter: Admit: 2020-01-17 | Discharge: 2020-01-17 | Payer: BC Managed Care – PPO

## 2020-01-17 MED FILL — MYCOPHENOLATE SODIUM 180 MG PO TBEC: 180 mg | ORAL | 30 days supply | Qty: 60 | Fill #6 | Status: AC

## 2020-01-17 MED FILL — TACROLIMUS 1 MG PO CAP: 1 mg | ORAL | 30 days supply | Qty: 120 | Fill #1 | Status: AC

## 2020-02-01 ENCOUNTER — Encounter: Admit: 2020-02-01 | Discharge: 2020-02-01 | Payer: BC Managed Care – PPO

## 2020-02-01 NOTE — Telephone Encounter
LVM notifying pt of appt time change from 1/4 @ 1240 to 1/4 @ 10:00am.

## 2020-02-09 ENCOUNTER — Encounter: Admit: 2020-02-09 | Discharge: 2020-02-09 | Payer: BC Managed Care – PPO

## 2020-02-09 MED ORDER — MYCOPHENOLATE SODIUM 180 MG PO TBEC
180 mg | ORAL_TABLET | Freq: Two times a day (BID) | ORAL | 1 refills
Start: 2020-02-09 — End: ?

## 2020-02-13 ENCOUNTER — Encounter: Admit: 2020-02-13 | Discharge: 2020-02-13 | Payer: BC Managed Care – PPO

## 2020-02-14 ENCOUNTER — Encounter: Admit: 2020-02-14 | Discharge: 2020-02-14 | Payer: BC Managed Care – PPO

## 2020-02-15 MED FILL — TACROLIMUS 1 MG PO CAP: 1 mg | ORAL | 30 days supply | Qty: 360 | Fill #2 | Status: AC

## 2020-02-15 MED FILL — MYCOPHENOLATE SODIUM 180 MG PO TBEC: 180 mg | ORAL | 30 days supply | Qty: 180 | Fill #1 | Status: AC

## 2020-03-05 ENCOUNTER — Encounter: Admit: 2020-03-05 | Discharge: 2020-03-05 | Payer: BC Managed Care – PPO

## 2020-03-05 NOTE — Telephone Encounter
Pt called to cancel her appt on 01/04 at 1000am due to her husband passing away. Pt is wanting to reschedule. Please give her a call.

## 2020-03-05 NOTE — Telephone Encounter
Appt cancelled

## 2020-03-06 ENCOUNTER — Encounter: Admit: 2020-03-06 | Discharge: 2020-03-06 | Payer: BC Managed Care – PPO

## 2020-03-16 ENCOUNTER — Encounter: Admit: 2020-03-16 | Discharge: 2020-03-16 | Payer: BC Managed Care – PPO

## 2020-03-17 MED FILL — MYCOPHENOLATE SODIUM 180 MG PO TBEC: 180 mg | ORAL | 30 days supply | Qty: 180 | Fill #2 | Status: AC

## 2020-03-17 MED FILL — TACROLIMUS 1 MG PO CAP: 1 mg | ORAL | 30 days supply | Qty: 120 | Fill #3 | Status: AC

## 2020-05-07 ENCOUNTER — Encounter: Admit: 2020-05-07 | Discharge: 2020-05-07 | Payer: BC Managed Care – PPO

## 2020-05-07 MED FILL — TACROLIMUS 1 MG PO CAP: 1 mg | ORAL | 30 days supply | Qty: 120 | Fill #4 | Status: AC

## 2020-05-07 MED FILL — MYCOPHENOLATE SODIUM 180 MG PO TBEC: 180 mg | ORAL | 30 days supply | Qty: 60 | Fill #3 | Status: AC

## 2020-05-29 ENCOUNTER — Encounter: Admit: 2020-05-29 | Discharge: 2020-05-29 | Payer: BC Managed Care – PPO

## 2020-05-29 DIAGNOSIS — Z944 Liver transplant status: Secondary | ICD-10-CM

## 2020-06-07 ENCOUNTER — Encounter: Admit: 2020-06-07 | Discharge: 2020-06-07 | Payer: BC Managed Care – PPO

## 2020-06-07 MED FILL — TACROLIMUS 1 MG PO CAP: 1 mg | ORAL | 30 days supply | Qty: 120 | Fill #5 | Status: AC

## 2020-06-07 MED FILL — MYCOPHENOLATE SODIUM 180 MG PO TBEC: 180 mg | ORAL | 30 days supply | Qty: 60 | Fill #4 | Status: AC

## 2020-06-13 ENCOUNTER — Encounter: Admit: 2020-06-13 | Discharge: 2020-06-13 | Payer: BC Managed Care – PPO

## 2020-06-13 DIAGNOSIS — Z944 Liver transplant status: Secondary | ICD-10-CM

## 2020-06-13 LAB — CBC AND DIFF
Lab: 1.5
Lab: 14
Lab: 14 — ABNORMAL HIGH (ref 74–106)
Lab: 2.5 mg/dL
Lab: 228
Lab: 28
Lab: 33
Lab: 4.6
Lab: 43
Lab: 5.1
Lab: 85 mg/dL

## 2020-06-13 LAB — COMPREHENSIVE METABOLIC PANEL: Lab: 137 mmol/L

## 2020-06-13 MED FILL — OMEPRAZOLE 20 MG PO CPDR: 20 mg | ORAL | 90 days supply | Qty: 90 | Fill #2 | Status: AC

## 2020-06-19 ENCOUNTER — Encounter: Admit: 2020-06-19 | Discharge: 2020-06-19 | Payer: BC Managed Care – PPO

## 2020-06-19 ENCOUNTER — Ambulatory Visit: Admit: 2020-06-19 | Discharge: 2020-06-19 | Payer: BC Managed Care – PPO

## 2020-06-19 DIAGNOSIS — K5792 Diverticulitis of intestine, part unspecified, without perforation or abscess without bleeding: Secondary | ICD-10-CM

## 2020-06-19 DIAGNOSIS — E669 Obesity, unspecified: Secondary | ICD-10-CM

## 2020-06-19 DIAGNOSIS — R609 Edema, unspecified: Secondary | ICD-10-CM

## 2020-06-19 DIAGNOSIS — I499 Cardiac arrhythmia, unspecified: Secondary | ICD-10-CM

## 2020-06-19 DIAGNOSIS — Z9289 Personal history of other medical treatment: Secondary | ICD-10-CM

## 2020-06-19 DIAGNOSIS — Z780 Asymptomatic menopausal state: Secondary | ICD-10-CM

## 2020-06-19 DIAGNOSIS — Z944 Liver transplant status: Secondary | ICD-10-CM

## 2020-06-19 DIAGNOSIS — G4730 Sleep apnea, unspecified: Secondary | ICD-10-CM

## 2020-06-19 DIAGNOSIS — D539 Nutritional anemia, unspecified: Secondary | ICD-10-CM

## 2020-06-19 DIAGNOSIS — F419 Anxiety disorder, unspecified: Secondary | ICD-10-CM

## 2020-06-19 DIAGNOSIS — R748 Abnormal levels of other serum enzymes: Secondary | ICD-10-CM

## 2020-06-19 DIAGNOSIS — N289 Disorder of kidney and ureter, unspecified: Secondary | ICD-10-CM

## 2020-06-19 DIAGNOSIS — K7210 Chronic hepatic failure without coma: Secondary | ICD-10-CM

## 2020-06-19 DIAGNOSIS — F1010 Alcohol abuse, uncomplicated: Secondary | ICD-10-CM

## 2020-06-19 DIAGNOSIS — I10 Essential (primary) hypertension: Secondary | ICD-10-CM

## 2020-06-19 DIAGNOSIS — E785 Hyperlipidemia, unspecified: Secondary | ICD-10-CM

## 2020-06-19 DIAGNOSIS — D259 Leiomyoma of uterus, unspecified: Secondary | ICD-10-CM

## 2020-06-19 DIAGNOSIS — T7840XA Allergy, unspecified, initial encounter: Secondary | ICD-10-CM

## 2020-06-19 MED ORDER — SERTRALINE 50 MG PO TAB
50 mg | ORAL_TABLET | Freq: Every day | ORAL | 0 refills | Status: AC
Start: 2020-06-19 — End: ?

## 2020-06-20 ENCOUNTER — Encounter: Admit: 2020-06-20 | Discharge: 2020-06-20 | Payer: BC Managed Care – PPO

## 2020-06-20 DIAGNOSIS — Z944 Liver transplant status: Secondary | ICD-10-CM

## 2020-06-20 LAB — TACROLIMUS LC-MS/MS: Lab: 8

## 2020-06-25 ENCOUNTER — Encounter: Admit: 2020-06-25 | Discharge: 2020-06-25 | Payer: BC Managed Care – PPO

## 2020-06-25 NOTE — Telephone Encounter
Called pt to f/u after appt. Confirmed that pt started Zoloft. Pt has not yet called Marolyn Haller to get stablished for counseling. Reviewed stable [place]. Confirmed plan to repeat labs in July at [facility]. Faxed new lab order to [facility] [phone].

## 2020-07-06 ENCOUNTER — Encounter: Admit: 2020-07-06 | Discharge: 2020-07-06 | Payer: BC Managed Care – PPO

## 2020-07-06 MED FILL — MYCOPHENOLATE SODIUM 180 MG PO TBEC: 180 mg | ORAL | 30 days supply | Qty: 60 | Fill #5 | Status: AC

## 2020-07-06 MED FILL — TACROLIMUS 1 MG PO CAP: 1 mg | ORAL | 30 days supply | Qty: 120 | Fill #6 | Status: AC

## 2020-09-17 ENCOUNTER — Encounter: Admit: 2020-09-17 | Discharge: 2020-09-17 | Payer: MEDICARE

## 2020-09-17 DIAGNOSIS — Z944 Liver transplant status: Secondary | ICD-10-CM

## 2020-09-17 MED ORDER — OMEPRAZOLE 20 MG PO CPDR
20 mg | ORAL_CAPSULE | Freq: Every day | ORAL | 1 refills | Status: AC
Start: 2020-09-17 — End: ?
  Filled 2020-09-18: qty 90, 90d supply, fill #1

## 2020-09-17 MED ORDER — TACROLIMUS 1 MG PO CAP
2 mg | ORAL_CAPSULE | Freq: Two times a day (BID) | ORAL | 1 refills | 30.00000 days | Status: AC
Start: 2020-09-17 — End: ?
  Filled 2020-09-20: qty 120, 30d supply, fill #1

## 2020-09-18 ENCOUNTER — Encounter: Admit: 2020-09-18 | Discharge: 2020-09-18 | Payer: MEDICARE

## 2020-09-19 ENCOUNTER — Encounter: Admit: 2020-09-19 | Discharge: 2020-09-19 | Payer: MEDICARE

## 2020-09-20 ENCOUNTER — Encounter: Admit: 2020-09-20 | Discharge: 2020-09-20 | Payer: MEDICARE

## 2020-09-20 MED FILL — MYCOPHENOLATE SODIUM 180 MG PO TBEC: 180 mg | ORAL | 30 days supply | Qty: 60 | Fill #6 | Status: AC

## 2020-09-25 ENCOUNTER — Encounter: Admit: 2020-09-25 | Discharge: 2020-09-25 | Payer: MEDICARE

## 2020-09-25 NOTE — Telephone Encounter
Pt returned my call. She states that she has been struggling with insomnia. She states that she has not found a PCP that is a good fit and inquires about Dr. Jeannine Kitten ordering something to help with sleep. She reports that melatonin has not helped. Advised that I will review with Dr. Jeannine Kitten and let her know. Asked about the zoloft ordered in April - pt confirms that she has been taking it, but it has not been helpful. Asked if she established with an alcohol counselor per Dr. Richardson Landry recommendation ( Dr. Jeannine Kitten recommended jennifer Lula Olszewski); pt states that she has not, but she will re-establish with the counselor she saw previously. Reminded pt that labs are due - pt states that she will complete these soon.

## 2020-09-25 NOTE — Telephone Encounter
Pt called with medication question. Please call her back.

## 2020-09-25 NOTE — Telephone Encounter
Returned call - no answer. LVM requesting call back.

## 2020-10-26 ENCOUNTER — Encounter: Admit: 2020-10-26 | Discharge: 2020-10-26 | Payer: MEDICARE

## 2020-10-26 DIAGNOSIS — Z944 Liver transplant status: Secondary | ICD-10-CM

## 2020-10-26 MED ORDER — MYCOPHENOLATE SODIUM 180 MG PO TBEC
180 mg | ORAL_TABLET | Freq: Two times a day (BID) | ORAL | 1 refills | Status: CN
Start: 2020-10-26 — End: ?

## 2020-10-26 NOTE — Telephone Encounter
Pt call routed to [facility]

## 2020-10-26 NOTE — Telephone Encounter
Pt requested refill on Myfortic - script updated with [facility]. Asked if pt has completed labs recently. She has not, but states that she will complete labs next week. She reports that she finally found a PCP and will establish with them on 9/13. Pt reports that the trazodone has "helped a little" with sleep.    Faxed lab order letter again to [facility] lab [phone].

## 2020-11-01 ENCOUNTER — Encounter: Admit: 2020-11-01 | Discharge: 2020-11-01 | Payer: MEDICARE

## 2020-11-01 MED FILL — TACROLIMUS 1 MG PO CAP: 1 mg | ORAL | 30 days supply | Qty: 120 | Fill #2 | Status: AC

## 2020-11-01 MED FILL — MYCOPHENOLATE SODIUM 180 MG PO TBEC: 180 mg | ORAL | 30 days supply | Qty: 60 | Fill #1 | Status: AC

## 2020-11-01 NOTE — Telephone Encounter
Spoke with patient cncld appt she will schedule at her own convenience

## 2020-11-06 ENCOUNTER — Encounter: Admit: 2020-11-06 | Discharge: 2020-11-06 | Payer: MEDICARE

## 2020-11-06 NOTE — Telephone Encounter
Audrey Oliver LVM in regards a standing lab order. The order expires 12/06/2020. She doesn't know if she needs to continue or if you wanted to stop the order. If you want to continue please and send a new order.    fax [phone]

## 2020-11-22 ENCOUNTER — Encounter: Admit: 2020-11-22 | Discharge: 2020-11-22 | Payer: MEDICARE

## 2020-11-22 NOTE — Telephone Encounter
[facility] lab - they state that no labs have been completed in the last 6+ months.     Attempted to call pt regarding overdue labs & cancelled clinic appt. No answer - LVM requesting that pt complete labs soon, notify me when labs have been completed, and call our office to get appt rescheduled.

## 2020-12-17 ENCOUNTER — Encounter: Admit: 2020-12-17 | Discharge: 2020-12-17 | Payer: MEDICARE

## 2020-12-18 MED FILL — OMEPRAZOLE 20 MG PO CPDR: 20 mg | ORAL | 90 days supply | Qty: 90 | Fill #2 | Status: AC

## 2020-12-18 MED FILL — TACROLIMUS 1 MG PO CAP: 1 mg | ORAL | 30 days supply | Qty: 120 | Fill #3 | Status: AC

## 2020-12-18 MED FILL — MYCOPHENOLATE SODIUM 180 MG PO TBEC: 180 mg | ORAL | 30 days supply | Qty: 60 | Fill #2 | Status: AC

## 2021-01-03 ENCOUNTER — Encounter: Admit: 2021-01-03 | Discharge: 2021-01-03 | Payer: MEDICARE

## 2021-01-21 ENCOUNTER — Encounter: Admit: 2021-01-21 | Discharge: 2021-01-21 | Payer: MEDICARE

## 2021-01-21 NOTE — Telephone Encounter
Pt called and stated that someone called her about an appt. She also stated that she does now know the NP Carole Civil and would like to be seen by Dr. Whitney Muse. Please call back.

## 2021-02-19 ENCOUNTER — Encounter: Admit: 2021-02-19 | Discharge: 2021-02-19 | Payer: MEDICARE

## 2021-02-19 DIAGNOSIS — K7689 Other specified diseases of liver: Secondary | ICD-10-CM

## 2021-02-19 DIAGNOSIS — Z944 Liver transplant status: Secondary | ICD-10-CM

## 2021-02-19 DIAGNOSIS — E559 Vitamin D deficiency, unspecified: Secondary | ICD-10-CM

## 2021-02-19 NOTE — Telephone Encounter
Patient called in to schedule a return appt.   Past Post [facility] pt of Jeannine Kitten.  I reviewed the protocol schedule with Otho Ket being the RN and I BELIEVE  this patient would go to Dr Einar Pheasant.  Please give pt a call to schedule.

## 2021-02-19 NOTE — Telephone Encounter
LVM for pt to call back to schedule an appt w. Caryl Pina

## 2021-02-19 NOTE — Telephone Encounter
Please see 01/21/21 note the patient requested to see Dr Lovena Le. Please contact pt to discuss before I contact to schedule appt w/ Dr Einar Pheasant.

## 2021-02-19 NOTE — Telephone Encounter
Spoke with patient. Explained plan to transition care to Dr. Einar Pheasant, keep Caryl Pina as NP, and keep me as CNC. Pt agreeable to this plan. Discussed overdue appointment & labs. Pt confirms that she has not gotten labs done recently. Asked if we can coordinate appointment with Caryl Pina in January and get labs in our office same day; pt agreeable to this plan.

## 2021-02-20 ENCOUNTER — Encounter: Admit: 2021-02-20 | Discharge: 2021-02-20 | Payer: MEDICARE

## 2021-02-21 MED FILL — TACROLIMUS 1 MG PO CAP: 1 mg | ORAL | 30 days supply | Qty: 120 | Fill #4 | Status: AC

## 2021-02-21 MED FILL — MYCOPHENOLATE SODIUM 180 MG PO TBEC: 180 mg | ORAL | 30 days supply | Qty: 60 | Fill #3 | Status: AC

## 2021-03-19 ENCOUNTER — Encounter: Admit: 2021-03-19 | Discharge: 2021-03-19 | Payer: MEDICARE

## 2021-03-19 NOTE — Telephone Encounter
Spoke with lab tech - she wanted to know if they could discard the expired standing lab orders because pt still has not been in to complete labs. Re-faxed new one-time lab orders to [facility] lab [phone].

## 2021-03-19 NOTE — Telephone Encounter
[place] hospital call routed to [employer]

## 2021-03-26 ENCOUNTER — Encounter: Admit: 2021-03-26 | Discharge: 2021-03-26 | Payer: MEDICARE

## 2021-03-26 MED ORDER — OMEPRAZOLE 20 MG PO CPDR
20 mg | ORAL_CAPSULE | Freq: Every day | ORAL | 1 refills | Status: AC
Start: 2021-03-26 — End: ?
  Filled 2021-03-26: qty 90, 90d supply, fill #1

## 2021-03-26 MED FILL — MYCOPHENOLATE SODIUM 180 MG PO TBEC: 180 mg | ORAL | 30 days supply | Qty: 60 | Fill #4 | Status: AC

## 2021-03-26 MED FILL — TACROLIMUS 1 MG PO CAP: 1 mg | ORAL | 30 days supply | Qty: 120 | Fill #5 | Status: AC

## 2021-03-26 NOTE — Telephone Encounter
Spoke to [place], scheduled appt 05/24/21 230 PM w/ Caryl Pina

## 2021-03-26 NOTE — Telephone Encounter
ptcalled in and need to see Dr. Mikey Bussing , to make appt. xfrd to [employer]

## 2021-04-24 ENCOUNTER — Encounter: Admit: 2021-04-24 | Discharge: 2021-04-24 | Payer: MEDICARE

## 2021-04-24 MED FILL — MYCOPHENOLATE SODIUM 180 MG PO TBEC: 180 mg | ORAL | 30 days supply | Qty: 60 | Fill #5 | Status: AC

## 2021-04-24 MED FILL — TACROLIMUS 1 MG PO CAP: 1 mg | ORAL | 30 days supply | Qty: 120 | Fill #6 | Status: AC

## 2021-05-14 ENCOUNTER — Encounter: Admit: 2021-05-14 | Discharge: 2021-05-14 | Payer: MEDICARE

## 2021-05-14 NOTE — Telephone Encounter
Faxed lab orders to [facility] [phone].

## 2021-05-14 NOTE — Telephone Encounter
Pt called in for lab orders to be sent to [facility]

## 2021-05-23 ENCOUNTER — Encounter: Admit: 2021-05-23 | Discharge: 2021-05-23 | Payer: MEDICARE

## 2021-05-23 NOTE — Telephone Encounter
Pt calling to cancel her appt for tomorrow  Had a death in family.

## 2021-05-23 NOTE — Telephone Encounter
Appointment is cancelled

## 2021-05-24 ENCOUNTER — Encounter: Admit: 2021-05-24 | Discharge: 2021-05-24 | Payer: MEDICARE

## 2021-06-04 ENCOUNTER — Encounter: Admit: 2021-06-04 | Discharge: 2021-06-04 | Payer: MEDICARE

## 2021-06-04 NOTE — Telephone Encounter
LVM letting patient know who her new hepatologist and NC are. Also stated a scheduler will be calling to get her rescheduled for a clinic visit with labs.

## 2021-06-05 ENCOUNTER — Encounter: Admit: 2021-06-05 | Discharge: 2021-06-05 | Payer: MEDICARE

## 2021-06-06 MED FILL — OMEPRAZOLE 20 MG PO CPDR: 20 mg | ORAL | 90 days supply | Qty: 90 | Fill #1 | Status: AC

## 2021-06-07 ENCOUNTER — Encounter: Admit: 2021-06-07 | Discharge: 2021-06-07 | Payer: MEDICARE

## 2021-06-08 MED FILL — TACROLIMUS 1 MG PO CAP: 1 mg | ORAL | 30 days supply | Qty: 120 | Fill #1 | Status: AC

## 2021-06-08 MED FILL — MYCOPHENOLATE SODIUM 180 MG PO TBEC: 180 mg | ORAL | 30 days supply | Qty: 60 | Fill #1 | Status: AC

## 2021-06-25 ENCOUNTER — Encounter: Admit: 2021-06-25 | Discharge: 2021-06-25 | Payer: MEDICARE

## 2021-06-26 ENCOUNTER — Encounter: Admit: 2021-06-26 | Discharge: 2021-06-26 | Payer: MEDICARE

## 2021-08-07 ENCOUNTER — Encounter: Admit: 2021-08-07 | Discharge: 2021-08-07 | Payer: MEDICARE

## 2021-08-08 ENCOUNTER — Encounter: Admit: 2021-08-08 | Discharge: 2021-08-08 | Payer: MEDICARE

## 2021-08-08 MED FILL — TACROLIMUS 1 MG PO CAP: 1 mg | ORAL | 30 days supply | Qty: 120 | Fill #2 | Status: AC

## 2021-08-08 MED FILL — MYCOPHENOLATE SODIUM 180 MG PO TBEC: 180 mg | ORAL | 30 days supply | Qty: 60 | Fill #2 | Status: AC

## 2021-08-31 ENCOUNTER — Encounter: Admit: 2021-08-31 | Discharge: 2021-08-31 | Payer: MEDICARE

## 2021-09-04 ENCOUNTER — Encounter: Admit: 2021-09-04 | Discharge: 2021-09-04 | Payer: MEDICARE

## 2021-09-04 MED FILL — TACROLIMUS 1 MG PO CAP: 1 mg | ORAL | 30 days supply | Qty: 120 | Fill #3 | Status: AC

## 2021-09-04 MED FILL — OMEPRAZOLE 20 MG PO CPDR: 20 mg | ORAL | 90 days supply | Qty: 90 | Fill #2 | Status: AC

## 2021-09-04 MED FILL — MYCOPHENOLATE SODIUM 180 MG PO TBEC: 180 mg | ORAL | 30 days supply | Qty: 60 | Fill #3 | Status: AC

## 2021-09-16 ENCOUNTER — Encounter: Admit: 2021-09-16 | Discharge: 2021-09-16 | Payer: MEDICARE

## 2021-10-07 ENCOUNTER — Encounter: Admit: 2021-10-07 | Discharge: 2021-10-07 | Payer: MEDICARE

## 2021-10-07 NOTE — Telephone Encounter
LVM for patient asking her to call back to reschedule with either Carole Civil or Dr. Con Memos and to let us know where she would like to go to get labs done.

## 2021-10-17 ENCOUNTER — Encounter: Admit: 2021-10-17 | Discharge: 2021-10-17 | Payer: MEDICARE

## 2021-11-25 ENCOUNTER — Encounter: Admit: 2021-11-25 | Discharge: 2021-11-25 | Payer: MEDICARE

## 2021-11-25 DIAGNOSIS — Z944 Liver transplant status: Secondary | ICD-10-CM

## 2021-11-25 DIAGNOSIS — Z79899 Other long term (current) drug therapy: Secondary | ICD-10-CM

## 2021-11-25 MED ORDER — OMEPRAZOLE 20 MG PO CPDR
20 mg | ORAL_CAPSULE | Freq: Every day | ORAL | 1 refills | Status: AC
Start: 2021-11-25 — End: ?
  Filled 2021-11-25: qty 90, 90d supply, fill #1

## 2021-11-25 MED ORDER — MYCOPHENOLATE SODIUM 180 MG PO TBEC
180 mg | ORAL_TABLET | Freq: Two times a day (BID) | ORAL | 0 refills | Status: AC
Start: 2021-11-25 — End: ?
  Filled 2021-11-25: qty 60, 30d supply, fill #1

## 2021-11-25 MED ORDER — TACROLIMUS 1 MG PO CAP
2 mg | ORAL_CAPSULE | Freq: Two times a day (BID) | ORAL | 0 refills | 30.00000 days | Status: AC
Start: 2021-11-25 — End: ?
  Filled 2021-11-25: qty 120, 30d supply, fill #1

## 2021-11-25 NOTE — Telephone Encounter
Pt called has an appt on 10/10 and would like to make it a TH. If not she would like to R/S for it to be a TH appt. Please call back.

## 2021-11-26 ENCOUNTER — Encounter: Admit: 2021-11-26 | Discharge: 2021-11-26 | Payer: MEDICARE

## 2021-11-27 ENCOUNTER — Encounter: Admit: 2021-11-27 | Discharge: 2021-11-27 | Payer: MEDICARE

## 2021-12-13 ENCOUNTER — Encounter: Admit: 2021-12-13 | Discharge: 2021-12-13 | Payer: MEDICARE

## 2021-12-18 ENCOUNTER — Encounter: Admit: 2021-12-18 | Discharge: 2021-12-18 | Payer: MEDICARE

## 2021-12-18 MED FILL — TACROLIMUS 1 MG PO CAP: 1 mg | ORAL | 30 days supply | Qty: 120 | Fill #2 | Status: AC

## 2021-12-18 MED FILL — MYCOPHENOLATE SODIUM 180 MG PO TBEC: 180 mg | ORAL | 30 days supply | Qty: 60 | Fill #2 | Status: AC

## 2021-12-18 NOTE — Telephone Encounter
Called pt to see if she got labs done. She stated she went this morning to [facility] and asked them to rush the fax to [place]. She stated that she was told the tacrolimus and Vit D would not make it in time for her appt tomorrow. NC thanked pt for information. Call concluded.

## 2021-12-19 ENCOUNTER — Ambulatory Visit
Admit: 2021-12-19 | Discharge: 2021-12-20 | Payer: MEDICARE | Primary: Student in an Organized Health Care Education/Training Program

## 2021-12-19 ENCOUNTER — Encounter
Admit: 2021-12-19 | Discharge: 2021-12-19 | Payer: MEDICARE | Primary: Student in an Organized Health Care Education/Training Program

## 2021-12-19 DIAGNOSIS — Z944 Liver transplant status: Secondary | ICD-10-CM

## 2021-12-19 DIAGNOSIS — Z940 Kidney transplant status: Secondary | ICD-10-CM

## 2021-12-19 DIAGNOSIS — I499 Cardiac arrhythmia, unspecified: Secondary | ICD-10-CM

## 2021-12-19 DIAGNOSIS — F109 Alcohol use disorder: Secondary | ICD-10-CM

## 2021-12-19 DIAGNOSIS — E785 Hyperlipidemia, unspecified: Secondary | ICD-10-CM

## 2021-12-19 DIAGNOSIS — D259 Leiomyoma of uterus, unspecified: Secondary | ICD-10-CM

## 2021-12-19 DIAGNOSIS — F1010 Alcohol abuse, uncomplicated: Secondary | ICD-10-CM

## 2021-12-19 DIAGNOSIS — D539 Nutritional anemia, unspecified: Secondary | ICD-10-CM

## 2021-12-19 DIAGNOSIS — F32A Depression, unspecified depression type: Secondary | ICD-10-CM

## 2021-12-19 DIAGNOSIS — F419 Anxiety disorder, unspecified: Secondary | ICD-10-CM

## 2021-12-19 DIAGNOSIS — Z9289 Personal history of other medical treatment: Secondary | ICD-10-CM

## 2021-12-19 DIAGNOSIS — K7210 Chronic hepatic failure without coma: Secondary | ICD-10-CM

## 2021-12-19 DIAGNOSIS — T7840XA Allergy, unspecified, initial encounter: Secondary | ICD-10-CM

## 2021-12-19 DIAGNOSIS — R609 Edema, unspecified: Secondary | ICD-10-CM

## 2021-12-19 DIAGNOSIS — E669 Obesity, unspecified: Secondary | ICD-10-CM

## 2021-12-19 DIAGNOSIS — I10 Essential (primary) hypertension: Secondary | ICD-10-CM

## 2021-12-19 DIAGNOSIS — E74819 Disorders of glucose transport, unspecified (HCC): Secondary | ICD-10-CM

## 2021-12-19 DIAGNOSIS — G4730 Sleep apnea, unspecified: Secondary | ICD-10-CM

## 2021-12-19 DIAGNOSIS — R748 Abnormal levels of other serum enzymes: Secondary | ICD-10-CM

## 2021-12-19 DIAGNOSIS — N289 Disorder of kidney and ureter, unspecified: Secondary | ICD-10-CM

## 2021-12-19 DIAGNOSIS — K5792 Diverticulitis of intestine, part unspecified, without perforation or abscess without bleeding: Secondary | ICD-10-CM

## 2021-12-19 DIAGNOSIS — Z780 Asymptomatic menopausal state: Secondary | ICD-10-CM

## 2021-12-19 DIAGNOSIS — D849 Immunodeficiency, unspecified: Secondary | ICD-10-CM

## 2021-12-19 NOTE — Progress Notes
Chest CT and DEXA faxed to [facility] [phone]

## 2021-12-27 ENCOUNTER — Encounter
Admit: 2021-12-27 | Discharge: 2021-12-27 | Payer: MEDICARE | Primary: Student in an Organized Health Care Education/Training Program

## 2021-12-27 DIAGNOSIS — D849 Immunodeficiency, unspecified: Secondary | ICD-10-CM

## 2021-12-27 DIAGNOSIS — Z944 Liver transplant status: Secondary | ICD-10-CM

## 2021-12-27 DIAGNOSIS — E559 Vitamin D deficiency, unspecified: Secondary | ICD-10-CM

## 2021-12-27 DIAGNOSIS — Z940 Kidney transplant status: Secondary | ICD-10-CM

## 2021-12-27 LAB — 25-OH VITAMIN D (D2 + D3): VITAMIN D (25-OH) TOTAL: 12 — ABNORMAL LOW (ref 30–100)

## 2021-12-27 NOTE — Telephone Encounter
Faxed new standing lab orders and 1 time A1C and lipids to [facility]

## 2021-12-30 ENCOUNTER — Encounter
Admit: 2021-12-30 | Discharge: 2021-12-30 | Payer: MEDICARE | Primary: Student in an Organized Health Care Education/Training Program

## 2021-12-30 DIAGNOSIS — D849 Immunodeficiency, unspecified: Secondary | ICD-10-CM

## 2021-12-30 DIAGNOSIS — Z940 Kidney transplant status: Secondary | ICD-10-CM

## 2021-12-30 DIAGNOSIS — Z944 Liver transplant status: Secondary | ICD-10-CM

## 2021-12-30 LAB — FK506 TACROLIMUS: TACROLIMUS: 5.8

## 2021-12-30 MED ORDER — ERGOCALCIFEROL (VITAMIN D2) 200 MCG/ML (8,000 UNIT/ML) PO DROP
50000 [IU] | ORAL | 0 refills | 56.00000 days | Status: AC
Start: 2021-12-30 — End: ?

## 2021-12-30 NOTE — Telephone Encounter
Patient called said her Vitamin D is low and she want to know what she can do or take please call [phone].

## 2021-12-30 NOTE — Telephone Encounter
LVM asking patient to confirm pharmacy either with a call back or MyChart message so we can send a prescription.

## 2021-12-30 NOTE — Telephone Encounter
Pt left VM returning your call about Vit D. She is asking to have that sent to [facility] in [place], [place]. She is also asking if there is anyway to get this medication in liquid form, she can't swallow the pills.

## 2021-12-30 NOTE — Telephone Encounter
Returned pt call and let her know we sent a liquid Vitamin D to the [facility] and reminded her this was also just once a week, not daily. This NC asked pt to call back if her pharmacy had any issues getting the liquid version. Pt verbalized understanding.

## 2022-01-01 ENCOUNTER — Encounter
Admit: 2022-01-01 | Discharge: 2022-01-01 | Payer: MEDICARE | Primary: Student in an Organized Health Care Education/Training Program

## 2022-01-02 ENCOUNTER — Encounter
Admit: 2022-01-02 | Discharge: 2022-01-02 | Payer: MEDICARE | Primary: Student in an Organized Health Care Education/Training Program

## 2022-01-06 ENCOUNTER — Encounter
Admit: 2022-01-06 | Discharge: 2022-01-06 | Payer: MEDICARE | Primary: Student in an Organized Health Care Education/Training Program

## 2022-01-06 MED FILL — OMEPRAZOLE 20 MG PO CPDR: 20 mg | ORAL | 90 days supply | Qty: 90 | Fill #1 | Status: AC

## 2022-01-07 ENCOUNTER — Encounter
Admit: 2022-01-07 | Discharge: 2022-01-07 | Payer: MEDICARE | Primary: Student in an Organized Health Care Education/Training Program

## 2022-01-08 ENCOUNTER — Encounter
Admit: 2022-01-08 | Discharge: 2022-01-08 | Payer: MEDICARE | Primary: Student in an Organized Health Care Education/Training Program

## 2022-01-10 ENCOUNTER — Encounter
Admit: 2022-01-10 | Discharge: 2022-01-10 | Payer: MEDICARE | Primary: Student in an Organized Health Care Education/Training Program

## 2022-01-10 MED FILL — TACROLIMUS 1 MG PO CAP: 1 mg | ORAL | 30 days supply | Qty: 120 | Fill #3 | Status: AC

## 2022-01-10 MED FILL — MYCOPHENOLATE SODIUM 180 MG PO TBEC: 180 mg | ORAL | 30 days supply | Qty: 60 | Fill #3 | Status: AC

## 2022-01-29 ENCOUNTER — Encounter
Admit: 2022-01-29 | Discharge: 2022-01-29 | Payer: MEDICARE | Primary: Student in an Organized Health Care Education/Training Program

## 2022-01-31 ENCOUNTER — Encounter
Admit: 2022-01-31 | Discharge: 2022-01-31 | Payer: MEDICARE | Primary: Student in an Organized Health Care Education/Training Program

## 2022-01-31 DIAGNOSIS — Z944 Liver transplant status: Secondary | ICD-10-CM

## 2022-01-31 MED ORDER — TACROLIMUS 1 MG PO CAP
2 mg | ORAL_CAPSULE | Freq: Two times a day (BID) | ORAL | 0 refills
Start: 2022-01-31 — End: ?

## 2022-01-31 MED ORDER — MYCOPHENOLATE SODIUM 180 MG PO TBEC
180 mg | ORAL_TABLET | Freq: Two times a day (BID) | ORAL | 0 refills
Start: 2022-01-31 — End: ?

## 2022-02-03 ENCOUNTER — Encounter
Admit: 2022-02-03 | Discharge: 2022-02-03 | Payer: MEDICARE | Primary: Student in an Organized Health Care Education/Training Program

## 2022-02-04 ENCOUNTER — Encounter
Admit: 2022-02-04 | Discharge: 2022-02-04 | Payer: MEDICARE | Primary: Student in an Organized Health Care Education/Training Program

## 2022-02-05 ENCOUNTER — Encounter
Admit: 2022-02-05 | Discharge: 2022-02-05 | Payer: MEDICARE | Primary: Student in an Organized Health Care Education/Training Program

## 2022-02-11 ENCOUNTER — Encounter
Admit: 2022-02-11 | Discharge: 2022-02-11 | Payer: MEDICARE | Primary: Student in an Organized Health Care Education/Training Program

## 2022-02-11 NOTE — Progress Notes
We were unable to reach the patient after three attempts. At this time, we will no longer attempt to contact the patient for the refill. The patient may be re-enrolled in the pharmacy refill management program at any time by contacting the pharmacy or refilling their medication. Ambulatory pharmacist notified.    Crist Fat, Lindy  [phone]

## 2022-03-11 ENCOUNTER — Encounter
Admit: 2022-03-11 | Discharge: 2022-03-11 | Payer: MEDICARE | Primary: Student in an Organized Health Care Education/Training Program

## 2022-03-11 NOTE — Telephone Encounter
LVM reminding patient it is time to get labs. Also inquired about pt getting a Chest CT, bone density scan and colonoscopy. Asked pt to call back or send MyChart message with an update.

## 2022-03-11 NOTE — Telephone Encounter
Faxed standing lab orders to [facility].

## 2022-03-19 ENCOUNTER — Encounter
Admit: 2022-03-19 | Discharge: 2022-03-19 | Payer: MEDICARE | Primary: Student in an Organized Health Care Education/Training Program

## 2022-04-01 ENCOUNTER — Encounter
Admit: 2022-04-01 | Discharge: 2022-04-01 | Payer: MEDICARE | Primary: Student in an Organized Health Care Education/Training Program

## 2022-04-09 ENCOUNTER — Encounter
Admit: 2022-04-09 | Discharge: 2022-04-09 | Payer: MEDICARE | Primary: Student in an Organized Health Care Education/Training Program

## 2022-04-18 ENCOUNTER — Encounter
Admit: 2022-04-18 | Discharge: 2022-04-18 | Payer: MEDICARE | Primary: Student in an Organized Health Care Education/Training Program

## 2022-04-18 DIAGNOSIS — Z944 Liver transplant status: Secondary | ICD-10-CM

## 2022-04-18 DIAGNOSIS — Z79899 Other long term (current) drug therapy: Secondary | ICD-10-CM

## 2022-04-18 MED ORDER — TACROLIMUS 1 MG PO CAP
2 mg | ORAL_CAPSULE | Freq: Two times a day (BID) | ORAL | 0 refills | 30.00000 days | Status: AC
Start: 2022-04-18 — End: ?
  Filled 2022-05-02: qty 120, 30d supply, fill #1

## 2022-04-18 MED ORDER — MYCOPHENOLATE SODIUM 180 MG PO TBEC
180 mg | ORAL_TABLET | Freq: Two times a day (BID) | ORAL | 0 refills | Status: AC
Start: 2022-04-18 — End: ?
  Filled 2022-05-02: qty 60, 30d supply, fill #1

## 2022-04-18 MED ORDER — OMEPRAZOLE 20 MG PO CPDR
20 mg | ORAL_CAPSULE | Freq: Every day | ORAL | 1 refills | Status: CN
Start: 2022-04-18 — End: ?

## 2022-04-18 MED FILL — OMEPRAZOLE 20 MG PO CPDR: 20 mg | ORAL | 90 days supply | Qty: 90 | Fill #1 | Status: AC

## 2022-04-18 NOTE — Telephone Encounter
NC spoke with patient about getting updated transplant labs. Patient stated she would go on Monday. Patient stated they had not heard from the pharmacy regarding her Tacrolimus, Myfortic and Omeprazole. NC will contact pharmacy.

## 2022-04-19 ENCOUNTER — Encounter
Admit: 2022-04-19 | Discharge: 2022-04-19 | Payer: MEDICARE | Primary: Student in an Organized Health Care Education/Training Program

## 2022-04-20 ENCOUNTER — Encounter
Admit: 2022-04-20 | Discharge: 2022-04-20 | Payer: MEDICARE | Primary: Student in an Organized Health Care Education/Training Program

## 2022-04-21 ENCOUNTER — Encounter
Admit: 2022-04-21 | Discharge: 2022-04-21 | Payer: MEDICARE | Primary: Student in an Organized Health Care Education/Training Program

## 2022-04-23 ENCOUNTER — Encounter
Admit: 2022-04-23 | Discharge: 2022-04-23 | Payer: MEDICARE | Primary: Student in an Organized Health Care Education/Training Program

## 2022-04-24 ENCOUNTER — Encounter
Admit: 2022-04-24 | Discharge: 2022-04-24 | Payer: MEDICARE | Primary: Student in an Organized Health Care Education/Training Program

## 2022-04-25 ENCOUNTER — Encounter
Admit: 2022-04-25 | Discharge: 2022-04-25 | Payer: MEDICARE | Primary: Student in an Organized Health Care Education/Training Program

## 2022-04-29 ENCOUNTER — Encounter
Admit: 2022-04-29 | Discharge: 2022-04-29 | Payer: MEDICARE | Primary: Student in an Organized Health Care Education/Training Program

## 2022-04-30 ENCOUNTER — Encounter
Admit: 2022-04-30 | Discharge: 2022-04-30 | Payer: MEDICARE | Primary: Student in an Organized Health Care Education/Training Program

## 2022-05-01 ENCOUNTER — Encounter
Admit: 2022-05-01 | Discharge: 2022-05-01 | Payer: MEDICARE | Primary: Student in an Organized Health Care Education/Training Program

## 2022-05-02 ENCOUNTER — Encounter
Admit: 2022-05-02 | Discharge: 2022-05-02 | Payer: MEDICARE | Primary: Student in an Organized Health Care Education/Training Program

## 2022-05-02 ENCOUNTER — Ambulatory Visit
Admit: 2022-05-02 | Discharge: 2022-05-02 | Payer: MEDICARE | Primary: Student in an Organized Health Care Education/Training Program

## 2022-05-03 ENCOUNTER — Encounter
Admit: 2022-05-03 | Discharge: 2022-05-03 | Payer: MEDICARE | Primary: Student in an Organized Health Care Education/Training Program

## 2022-05-04 ENCOUNTER — Encounter
Admit: 2022-05-04 | Discharge: 2022-05-04 | Payer: MEDICARE | Primary: Student in an Organized Health Care Education/Training Program

## 2022-05-04 MED FILL — OMEPRAZOLE 20 MG PO CPDR: 20 mg | ORAL | 90 days supply | Qty: 90 | Fill #1 | Status: AC

## 2022-05-16 ENCOUNTER — Encounter
Admit: 2022-05-16 | Discharge: 2022-05-16 | Payer: MEDICARE | Primary: Student in an Organized Health Care Education/Training Program

## 2022-05-21 ENCOUNTER — Encounter
Admit: 2022-05-21 | Discharge: 2022-05-21 | Payer: MEDICARE | Primary: Student in an Organized Health Care Education/Training Program

## 2022-05-23 ENCOUNTER — Encounter
Admit: 2022-05-23 | Discharge: 2022-05-23 | Payer: MEDICARE | Primary: Student in an Organized Health Care Education/Training Program

## 2022-05-26 ENCOUNTER — Encounter
Admit: 2022-05-26 | Discharge: 2022-05-26 | Payer: MEDICARE | Primary: Student in an Organized Health Care Education/Training Program

## 2022-05-27 ENCOUNTER — Encounter
Admit: 2022-05-27 | Discharge: 2022-05-27 | Payer: MEDICARE | Primary: Student in an Organized Health Care Education/Training Program

## 2022-05-27 NOTE — Progress Notes
Contacted Audrey Oliver to refill their medication(s) MYCOPHENOLATE SODIUM 180 MG PO TBEC, TACROLIMUS 1 MG PO CP24.    We were unable to reach the patient after three attempts. At this time, we will no longer attempt to contact the patient for the refill. The patient may be re-enrolled in the pharmacy refill management program at any time by contacting the pharmacy or refilling their medication. Ambulatory pharmacist notified.    [place]  [phone]

## 2022-06-05 ENCOUNTER — Encounter
Admit: 2022-06-05 | Discharge: 2022-06-05 | Payer: MEDICARE | Primary: Student in an Organized Health Care Education/Training Program

## 2022-06-11 ENCOUNTER — Encounter
Admit: 2022-06-11 | Discharge: 2022-06-11 | Payer: MEDICARE | Primary: Student in an Organized Health Care Education/Training Program

## 2022-06-11 MED FILL — MYCOPHENOLATE SODIUM 180 MG PO TBEC: 180 mg | ORAL | 30 days supply | Qty: 60 | Fill #2 | Status: AC

## 2022-06-11 MED FILL — TACROLIMUS 1 MG PO CAP: 1 mg | ORAL | 30 days supply | Qty: 120 | Fill #2 | Status: AC

## 2022-06-28 ENCOUNTER — Encounter
Admit: 2022-06-28 | Discharge: 2022-06-28 | Payer: MEDICARE | Primary: Student in an Organized Health Care Education/Training Program

## 2022-07-03 ENCOUNTER — Encounter
Admit: 2022-07-03 | Discharge: 2022-07-03 | Payer: MEDICARE | Primary: Student in an Organized Health Care Education/Training Program

## 2022-07-04 ENCOUNTER — Encounter
Admit: 2022-07-04 | Discharge: 2022-07-04 | Payer: MEDICARE | Primary: Student in an Organized Health Care Education/Training Program

## 2022-07-07 ENCOUNTER — Encounter
Admit: 2022-07-07 | Discharge: 2022-07-07 | Payer: MEDICARE | Primary: Student in an Organized Health Care Education/Training Program

## 2022-07-18 ENCOUNTER — Encounter
Admit: 2022-07-18 | Discharge: 2022-07-18 | Payer: MEDICARE | Primary: Student in an Organized Health Care Education/Training Program

## 2022-07-18 NOTE — Telephone Encounter
NC attempted to contact patient to get updated labs. No answer. NC LVM with call back number.

## 2022-07-22 ENCOUNTER — Encounter
Admit: 2022-07-22 | Discharge: 2022-07-22 | Payer: MEDICARE | Primary: Student in an Organized Health Care Education/Training Program

## 2022-07-22 MED FILL — MYCOPHENOLATE SODIUM 180 MG PO TBEC: 180 mg | ORAL | 30 days supply | Qty: 60 | Fill #3 | Status: AC

## 2022-07-22 MED FILL — TACROLIMUS 1 MG PO CAP: 1 mg | ORAL | 30 days supply | Qty: 120 | Fill #3 | Status: AC

## 2022-08-08 ENCOUNTER — Encounter
Admit: 2022-08-08 | Discharge: 2022-08-08 | Payer: MEDICARE | Primary: Student in an Organized Health Care Education/Training Program

## 2022-08-13 ENCOUNTER — Encounter
Admit: 2022-08-13 | Discharge: 2022-08-13 | Payer: MEDICARE | Primary: Student in an Organized Health Care Education/Training Program

## 2022-08-13 DIAGNOSIS — Z944 Liver transplant status: Secondary | ICD-10-CM

## 2022-08-13 MED ORDER — MYCOPHENOLATE SODIUM 180 MG PO TBEC
180 mg | ORAL_TABLET | Freq: Two times a day (BID) | ORAL | 0 refills
Start: 2022-08-13 — End: ?

## 2022-08-13 MED ORDER — TACROLIMUS 1 MG PO CAP
2 mg | ORAL_CAPSULE | Freq: Two times a day (BID) | ORAL | 0 refills
Start: 2022-08-13 — End: ?

## 2022-08-14 ENCOUNTER — Encounter
Admit: 2022-08-14 | Discharge: 2022-08-14 | Payer: MEDICARE | Primary: Student in an Organized Health Care Education/Training Program

## 2022-08-18 ENCOUNTER — Encounter
Admit: 2022-08-18 | Discharge: 2022-08-18 | Payer: MEDICARE | Primary: Student in an Organized Health Care Education/Training Program

## 2022-08-19 ENCOUNTER — Encounter
Admit: 2022-08-19 | Discharge: 2022-08-19 | Payer: MEDICARE | Primary: Student in an Organized Health Care Education/Training Program

## 2022-08-19 NOTE — Telephone Encounter
NC contacted patient to remind her she is due for transplant labs. NC confirmed patient would like to go to [facility] lab. Patient agreed and stated she would get updated labs.

## 2022-08-20 ENCOUNTER — Encounter
Admit: 2022-08-20 | Discharge: 2022-08-20 | Payer: MEDICARE | Primary: Student in an Organized Health Care Education/Training Program

## 2022-08-20 DIAGNOSIS — Z944 Liver transplant status: Secondary | ICD-10-CM

## 2022-08-20 MED ORDER — MYCOPHENOLATE SODIUM 180 MG PO TBEC
180 mg | ORAL_TABLET | Freq: Two times a day (BID) | ORAL | 0 refills | Status: AC
Start: 2022-08-20 — End: ?
  Filled 2022-09-04: qty 60, 30d supply, fill #1

## 2022-08-20 MED ORDER — TACROLIMUS 1 MG PO CAP
2 mg | ORAL_CAPSULE | Freq: Two times a day (BID) | ORAL | 0 refills | 30.00000 days | Status: AC
Start: 2022-08-20 — End: ?
  Filled 2022-09-04: qty 120, 30d supply, fill #1

## 2022-08-21 ENCOUNTER — Encounter
Admit: 2022-08-21 | Discharge: 2022-08-21 | Payer: MEDICARE | Primary: Student in an Organized Health Care Education/Training Program

## 2022-08-22 ENCOUNTER — Encounter
Admit: 2022-08-22 | Discharge: 2022-08-22 | Payer: MEDICARE | Primary: Student in an Organized Health Care Education/Training Program

## 2022-08-22 NOTE — Progress Notes
Contacted Zyanna Leisinger Katen to refill their medication(s) TACROLIMUS 1 MG PO TB24, MYCOPHENOLATE SODIUM 180 MG PO TBEC.    We were unable to reach the patient after three attempts. At this time, we will no longer attempt to contact the patient for the refill. The patient may be re-enrolled in the pharmacy refill management program at any time by contacting the pharmacy or refilling their medication. Ambulatory pharmacist notified.    Sharrie Rothman  [facility]  [phone]

## 2022-08-26 ENCOUNTER — Encounter
Admit: 2022-08-26 | Discharge: 2022-08-26 | Payer: MEDICARE | Primary: Student in an Organized Health Care Education/Training Program

## 2022-08-27 ENCOUNTER — Encounter
Admit: 2022-08-27 | Discharge: 2022-08-27 | Payer: MEDICARE | Primary: Student in an Organized Health Care Education/Training Program

## 2022-09-01 ENCOUNTER — Encounter
Admit: 2022-09-01 | Discharge: 2022-09-01 | Payer: MEDICARE | Primary: Student in an Organized Health Care Education/Training Program

## 2022-09-03 ENCOUNTER — Encounter
Admit: 2022-09-03 | Discharge: 2022-09-03 | Payer: MEDICARE | Primary: Student in an Organized Health Care Education/Training Program

## 2022-09-04 MED FILL — OMEPRAZOLE 20 MG PO CPDR: 20 mg | ORAL | 90 days supply | Qty: 90 | Fill #2 | Status: AC

## 2022-09-08 ENCOUNTER — Encounter
Admit: 2022-09-08 | Discharge: 2022-09-08 | Payer: MEDICARE | Primary: Student in an Organized Health Care Education/Training Program

## 2022-09-11 ENCOUNTER — Encounter
Admit: 2022-09-11 | Discharge: 2022-09-11 | Payer: MEDICARE | Primary: Student in an Organized Health Care Education/Training Program

## 2022-09-22 ENCOUNTER — Encounter
Admit: 2022-09-22 | Discharge: 2022-09-22 | Payer: MEDICARE | Primary: Student in an Organized Health Care Education/Training Program

## 2022-09-24 ENCOUNTER — Encounter
Admit: 2022-09-24 | Discharge: 2022-09-24 | Payer: MEDICARE | Primary: Student in an Organized Health Care Education/Training Program

## 2022-09-25 ENCOUNTER — Encounter
Admit: 2022-09-25 | Discharge: 2022-09-25 | Payer: MEDICARE | Primary: Student in an Organized Health Care Education/Training Program

## 2022-09-26 ENCOUNTER — Encounter
Admit: 2022-09-26 | Discharge: 2022-09-26 | Payer: MEDICARE | Primary: Student in an Organized Health Care Education/Training Program

## 2022-09-29 ENCOUNTER — Encounter
Admit: 2022-09-29 | Discharge: 2022-09-29 | Payer: MEDICARE | Primary: Student in an Organized Health Care Education/Training Program

## 2022-09-29 NOTE — Progress Notes
Contacted [name] to refill their medication(s) MYCOPHENOLATE SODIUM 180 MG PO TBEC, TACROLIMUS 1 MG PO CAP.    We were unable to reach the patient after three attempts. At this time, we will no longer attempt to contact the patient for the refill. The patient may be re-enrolled in the pharmacy refill management program at any time by contacting the pharmacy or refilling their medication. Ambulatory pharmacist notified.    Dimas Chyle  [facility]  [phone]

## 2022-11-06 ENCOUNTER — Encounter
Admit: 2022-11-06 | Discharge: 2022-11-06 | Payer: MEDICARE | Primary: Student in an Organized Health Care Education/Training Program

## 2022-11-07 ENCOUNTER — Encounter
Admit: 2022-11-07 | Discharge: 2022-11-07 | Payer: MEDICARE | Primary: Student in an Organized Health Care Education/Training Program

## 2022-11-08 ENCOUNTER — Encounter
Admit: 2022-11-08 | Discharge: 2022-11-08 | Payer: MEDICARE | Primary: Student in an Organized Health Care Education/Training Program

## 2022-11-08 MED FILL — TACROLIMUS 1 MG PO CAP: 1 mg | ORAL | 30 days supply | Qty: 120 | Fill #2 | Status: AC

## 2022-11-08 MED FILL — MYCOPHENOLATE SODIUM 180 MG PO TBEC: 180 mg | ORAL | 30 days supply | Qty: 60 | Fill #2 | Status: AC

## 2022-11-27 ENCOUNTER — Encounter
Admit: 2022-11-27 | Discharge: 2022-11-27 | Payer: MEDICARE | Primary: Student in an Organized Health Care Education/Training Program

## 2022-12-01 ENCOUNTER — Encounter
Admit: 2022-12-01 | Discharge: 2022-12-01 | Payer: MEDICARE | Primary: Student in an Organized Health Care Education/Training Program

## 2022-12-02 ENCOUNTER — Encounter
Admit: 2022-12-02 | Discharge: 2022-12-02 | Payer: MEDICARE | Primary: Student in an Organized Health Care Education/Training Program

## 2022-12-03 ENCOUNTER — Encounter
Admit: 2022-12-03 | Discharge: 2022-12-03 | Payer: MEDICARE | Primary: Student in an Organized Health Care Education/Training Program

## 2022-12-03 NOTE — Progress Notes
Contacted Audrey Oliver to refill their medication(s) MYCOPHENOLATE SODIUM 180 MG PO TBEC, TACROLIMUS 1 MG PO CP24.    [name]  Outpatient [facility] Pharmacy  [phone]

## 2022-12-29 ENCOUNTER — Encounter
Admit: 2022-12-29 | Discharge: 2022-12-29 | Payer: MEDICARE | Primary: Student in an Organized Health Care Education/Training Program

## 2022-12-29 NOTE — Telephone Encounter
Pt called in to cancel today's appointment. Please return call to reschedule

## 2022-12-29 NOTE — Telephone Encounter
Returned call. Left voicemail notifying the patient that the appointment for today 10/28 has been cancelled and to return call to reschedule.

## 2022-12-30 ENCOUNTER — Encounter
Admit: 2022-12-30 | Discharge: 2022-12-30 | Payer: MEDICARE | Primary: Student in an Organized Health Care Education/Training Program

## 2022-12-30 NOTE — Telephone Encounter
Left message requesting if patient has gotten transplant labs done so this NC could request results. Also, reminded patient to call us to reschedule her cancelled appointment.

## 2023-01-01 ENCOUNTER — Encounter
Admit: 2023-01-01 | Discharge: 2023-01-01 | Payer: MEDICARE | Primary: Student in an Organized Health Care Education/Training Program

## 2023-01-06 ENCOUNTER — Encounter
Admit: 2023-01-06 | Discharge: 2023-01-06 | Payer: MEDICARE | Primary: Student in an Organized Health Care Education/Training Program

## 2023-01-07 ENCOUNTER — Encounter
Admit: 2023-01-07 | Discharge: 2023-01-07 | Payer: MEDICARE | Primary: Student in an Organized Health Care Education/Training Program

## 2023-01-07 NOTE — Telephone Encounter
See 10/28 TE    Left voicemail for the patient to return call to reschedule October appointment.

## 2023-01-14 ENCOUNTER — Encounter
Admit: 2023-01-14 | Discharge: 2023-01-14 | Payer: MEDICARE | Primary: Student in an Organized Health Care Education/Training Program

## 2023-01-14 NOTE — Telephone Encounter
Left message for patient to return call to schedule follow up visit and to get updated labs.

## 2023-01-23 ENCOUNTER — Encounter
Admit: 2023-01-23 | Discharge: 2023-01-23 | Payer: MEDICARE | Primary: Student in an Organized Health Care Education/Training Program

## 2023-01-27 ENCOUNTER — Encounter
Admit: 2023-01-27 | Discharge: 2023-01-27 | Payer: MEDICARE | Primary: Student in an Organized Health Care Education/Training Program

## 2023-02-04 ENCOUNTER — Encounter
Admit: 2023-02-04 | Discharge: 2023-02-04 | Payer: MEDICARE | Primary: Student in an Organized Health Care Education/Training Program

## 2023-02-04 NOTE — Telephone Encounter
See 10/28 & 11/6 TE    Left voicemail for the patient to return call to reschedule missed appointment.     Sent letter

## 2023-05-19 ENCOUNTER — Encounter
Admit: 2023-05-19 | Discharge: 2023-05-19 | Payer: MEDICARE | Primary: Student in an Organized Health Care Education/Training Program

## 2023-05-19 DIAGNOSIS — Z79899 Other long term (current) drug therapy: Secondary | ICD-10-CM

## 2023-05-19 DIAGNOSIS — Z944 Liver transplant status: Secondary | ICD-10-CM

## 2023-05-19 MED ORDER — SERTRALINE 100 MG PO TAB
150 mg | ORAL_TABLET | Freq: Every day | ORAL | 1 refills | Status: AC
Start: 2023-05-19 — End: ?

## 2023-05-19 MED ORDER — OMEPRAZOLE 20 MG PO CPDR
20 mg | ORAL_CAPSULE | Freq: Every day | ORAL | 1 refills | Status: AC
Start: 2023-05-19 — End: ?

## 2023-05-19 NOTE — Telephone Encounter
 Pt called and stated that she has a lot of personal things come up and hasn't been able to reschedule appt or get her medication.  She would like to speak to nurse about her medication and trying to get refills.  Please call back
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
## 2023-05-19 NOTE — Telephone Encounter
 Pt's spouse called requesting a return call regarding a refill on pt's medication. Stated that he thinks it is causing anxiety. Please return call.

## 2023-05-20 ENCOUNTER — Encounter
Admit: 2023-05-20 | Discharge: 2023-05-20 | Payer: MEDICARE | Primary: Student in an Organized Health Care Education/Training Program

## 2023-05-20 NOTE — Telephone Encounter
 Left voicemail for the patient to return call to schedule follow up appointment.     Sent unable to contact letter

## 2023-06-18 ENCOUNTER — Encounter
Admit: 2023-06-18 | Discharge: 2023-06-18 | Payer: MEDICARE | Primary: Student in an Organized Health Care Education/Training Program

## 2023-06-22 ENCOUNTER — Encounter
Admit: 2023-06-22 | Discharge: 2023-06-22 | Payer: MEDICARE | Primary: Student in an Organized Health Care Education/Training Program

## 2023-06-22 NOTE — Telephone Encounter
 See TE's    Left voicemail for the patient to return call to schedule follow up appointment.     Sent warning letter

## 2023-06-24 ENCOUNTER — Encounter
Admit: 2023-06-24 | Discharge: 2023-06-24 | Payer: MEDICARE | Primary: Student in an Organized Health Care Education/Training Program

## 2023-06-24 NOTE — Telephone Encounter
 Pt called in to schedule an appointment. Pt has asked for a return call

## 2023-06-24 NOTE — Telephone Encounter
 Returned call. Spoke with the patient to schedule follow up appointment. Patient has been scheduled to be seen on 10/12/23 at 1040AM.

## 2023-08-18 ENCOUNTER — Encounter
Admit: 2023-08-18 | Discharge: 2023-08-18 | Payer: MEDICARE | Primary: Student in an Organized Health Care Education/Training Program

## 2023-08-18 DIAGNOSIS — E74819 Disorders of glucose transport, unspecified: Secondary | ICD-10-CM

## 2023-08-18 DIAGNOSIS — Z944 Liver transplant status: Secondary | ICD-10-CM

## 2023-08-18 DIAGNOSIS — Z79899 Other long term (current) drug therapy: Secondary | ICD-10-CM

## 2023-08-18 DIAGNOSIS — F109 Alcohol use disorder: Secondary | ICD-10-CM

## 2023-08-18 DIAGNOSIS — D849 Immunodeficiency, unspecified: Secondary | ICD-10-CM

## 2023-08-18 DIAGNOSIS — E66812 Class 2 severe obesity due to excess calories with serious comorbidity in adult, unspecified BMI (CMS-HCC): Secondary | ICD-10-CM

## 2023-08-18 DIAGNOSIS — E559 Vitamin D deficiency, unspecified: Secondary | ICD-10-CM

## 2023-08-18 DIAGNOSIS — F1011 Alcohol abuse, in remission: Secondary | ICD-10-CM

## 2023-08-18 DIAGNOSIS — F32A Depression, unspecified depression type: Secondary | ICD-10-CM

## 2023-08-18 DIAGNOSIS — F419 Anxiety disorder, unspecified: Secondary | ICD-10-CM

## 2023-08-18 MED ORDER — MYCOPHENOLATE SODIUM 180 MG PO TBEC
180 mg | ORAL_TABLET | Freq: Two times a day (BID) | ORAL | 0 refills | 30.00000 days | Status: AC
Start: 2023-08-18 — End: ?
  Filled 2023-08-19: qty 180, 90d supply, fill #1

## 2023-08-18 MED ORDER — TACROLIMUS 1 MG PO CAP
2 mg | ORAL_CAPSULE | Freq: Two times a day (BID) | ORAL | 2 refills | 30.00000 days | Status: AC
Start: 2023-08-18 — End: ?
  Filled 2023-08-19: qty 120, 30d supply, fill #1

## 2023-08-18 MED ORDER — OMEPRAZOLE 20 MG PO CPDR
20 mg | ORAL_CAPSULE | Freq: Every day | ORAL | 1 refills | 90.00000 days | Status: AC
Start: 2023-08-18 — End: ?
  Filled 2023-08-19: qty 90, 90d supply, fill #1

## 2023-08-18 NOTE — Telephone Encounter
 Pt called stating that she only has enough of her tacrolimus (PROGRAF) 1 mg capsule , omeprazole DR (PRILOSEC) 20 mg capsule , & mycophenolate sodium (MYFORTIC) 180 mg tablet,delayed release for her evening doses. Pt stated that [facility] is charging her $500 for refills on her meds, so she would like scripts submitted to [facility] instead. Stephany NC advised that she would submit scripts for pt, and wanted to remind pt that she is due for labs. Informed pt, and pt stated that she plans to do them on 7/11. Informed NC. Pt then asked to update her payment information for the pharmacy. Provided direct contact number for and transferred pt to [facility].

## 2023-08-18 NOTE — Telephone Encounter
 Sent requested scripts to [facility] per patient request. Placed labs for patient to have done when at [facility] 7/11.

## 2023-08-19 ENCOUNTER — Encounter
Admit: 2023-08-19 | Discharge: 2023-08-19 | Payer: MEDICARE | Primary: Student in an Organized Health Care Education/Training Program

## 2023-09-07 ENCOUNTER — Encounter
Admit: 2023-09-07 | Discharge: 2023-09-07 | Payer: MEDICARE | Primary: Student in an Organized Health Care Education/Training Program

## 2023-09-11 ENCOUNTER — Encounter
Admit: 2023-09-11 | Discharge: 2023-09-11 | Payer: MEDICARE | Primary: Student in an Organized Health Care Education/Training Program

## 2023-09-11 NOTE — Telephone Encounter
 Left message for patient requesting she do labs prior to clinic appointment in August. Patient is past due. Instructed patient could go to any outpatient [facility] lab to get drawn and that orders are under Dr. Jackqulyn name. Left call back number and instructions to call if patient has questions.

## 2023-09-11 NOTE — Telephone Encounter
 Patient returned called. Stated will get labs done at [facility] lab ASAP. Faxed lab orders to [facility].

## 2023-09-14 ENCOUNTER — Encounter
Admit: 2023-09-14 | Discharge: 2023-09-14 | Payer: MEDICARE | Primary: Student in an Organized Health Care Education/Training Program

## 2023-09-15 ENCOUNTER — Encounter
Admit: 2023-09-15 | Discharge: 2023-09-15 | Payer: MEDICARE | Primary: Student in an Organized Health Care Education/Training Program

## 2023-09-16 ENCOUNTER — Encounter
Admit: 2023-09-16 | Discharge: 2023-09-16 | Payer: MEDICARE | Primary: Student in an Organized Health Care Education/Training Program

## 2023-09-16 NOTE — Progress Notes
 Contacted Audrey Oliver to refill their medication(s) TACROLIMUS  1 MG PO CAP.    We were unable to reach the patient after three attempts. At this time, we will no longer attempt to contact the patient for the refill. The patient may be re-enrolled in the pharmacy refill management program at any time by contacting the pharmacy or refilling their medication. Ambulatory pharmacist notified.    Ginnie Hutchinson  [facility]  [phone]

## 2023-09-25 ENCOUNTER — Encounter
Admit: 2023-09-25 | Discharge: 2023-09-25 | Payer: MEDICARE | Primary: Student in an Organized Health Care Education/Training Program

## 2023-10-08 ENCOUNTER — Encounter
Admit: 2023-10-08 | Discharge: 2023-10-08 | Payer: MEDICARE | Primary: Student in an Organized Health Care Education/Training Program

## 2023-10-08 NOTE — Telephone Encounter
 Are you calling to cancel an upcoming appointment? Yes  08/11  What is the reason for cancelling the appointment? Will be out of town still  Do you want to reschedule the appointment? Yes   What is the best number to reach you? Cell

## 2023-10-08 NOTE — Telephone Encounter
 Returned call. Left voicemail for the patient to return call to reschedule the 8/11 appointment.

## 2023-10-26 ENCOUNTER — Encounter
Admit: 2023-10-26 | Discharge: 2023-10-26 | Payer: MEDICARE | Primary: Student in an Organized Health Care Education/Training Program

## 2023-10-26 DIAGNOSIS — Z944 Liver transplant status: Secondary | ICD-10-CM

## 2023-10-26 DIAGNOSIS — D849 Immunodeficiency, unspecified: Principal | ICD-10-CM

## 2023-10-26 MED ORDER — MYCOPHENOLATE SODIUM 180 MG PO TBEC
180 mg | ORAL_TABLET | Freq: Two times a day (BID) | ORAL | 0 refills
Start: 2023-10-26 — End: ?

## 2023-10-26 MED FILL — TACROLIMUS 1 MG PO CAP: 1 mg | ORAL | 30 days supply | Qty: 120 | Fill #1 | Status: AC

## 2023-10-26 NOTE — Progress Notes
 Contacted Audrey Oliver to refill their medication(s) MYFORTIC  180 MG PO TBEC, PROGRAF  1 MG PO CAP.    Patient declined transfer to the pharmacist to discuss, but during medication refill call patient reported missed doses: Missed last four doses due to a situation which she did not care to discuss.     The refill was processed. Ambulatory pharmacist notified.      Katrinka Blaze  [facility]  [phone]

## 2023-10-27 ENCOUNTER — Encounter
Admit: 2023-10-27 | Discharge: 2023-10-27 | Payer: MEDICARE | Primary: Student in an Organized Health Care Education/Training Program

## 2023-10-28 ENCOUNTER — Encounter
Admit: 2023-10-28 | Discharge: 2023-10-28 | Payer: MEDICARE | Primary: Student in an Organized Health Care Education/Training Program

## 2023-11-04 ENCOUNTER — Encounter
Admit: 2023-11-04 | Discharge: 2023-11-04 | Payer: MEDICARE | Primary: Student in an Organized Health Care Education/Training Program

## 2023-11-05 ENCOUNTER — Encounter
Admit: 2023-11-05 | Discharge: 2023-11-05 | Payer: MEDICARE | Primary: Student in an Organized Health Care Education/Training Program

## 2023-11-11 ENCOUNTER — Encounter
Admit: 2023-11-11 | Discharge: 2023-11-11 | Payer: MEDICARE | Primary: Student in an Organized Health Care Education/Training Program

## 2023-11-11 NOTE — Telephone Encounter
 Left voicemail for the patient to return call to reschedule cancelled follow up.    Sent unable to contact letter

## 2023-11-12 ENCOUNTER — Encounter
Admit: 2023-11-12 | Discharge: 2023-11-12 | Payer: MEDICARE | Primary: Student in an Organized Health Care Education/Training Program

## 2023-11-18 ENCOUNTER — Encounter
Admit: 2023-11-18 | Discharge: 2023-11-18 | Payer: MEDICARE | Primary: Student in an Organized Health Care Education/Training Program

## 2023-11-18 MED FILL — TACROLIMUS 1 MG PO CAP: 1 mg | ORAL | 30 days supply | Qty: 120 | Fill #2 | Status: AC

## 2023-11-25 ENCOUNTER — Encounter
Admit: 2023-11-25 | Discharge: 2023-11-25 | Payer: MEDICARE | Primary: Student in an Organized Health Care Education/Training Program

## 2023-11-26 ENCOUNTER — Encounter
Admit: 2023-11-26 | Discharge: 2023-11-26 | Payer: MEDICARE | Primary: Student in an Organized Health Care Education/Training Program

## 2023-11-27 ENCOUNTER — Encounter
Admit: 2023-11-27 | Discharge: 2023-11-27 | Payer: MEDICARE | Primary: Student in an Organized Health Care Education/Training Program

## 2023-11-30 ENCOUNTER — Encounter
Admit: 2023-11-30 | Discharge: 2023-11-30 | Payer: MEDICARE | Primary: Student in an Organized Health Care Education/Training Program

## 2023-12-01 ENCOUNTER — Encounter
Admit: 2023-12-01 | Discharge: 2023-12-01 | Payer: MEDICARE | Primary: Student in an Organized Health Care Education/Training Program

## 2023-12-02 ENCOUNTER — Encounter
Admit: 2023-12-02 | Discharge: 2023-12-02 | Payer: MEDICARE | Primary: Student in an Organized Health Care Education/Training Program

## 2023-12-03 ENCOUNTER — Encounter
Admit: 2023-12-03 | Discharge: 2023-12-03 | Payer: MEDICARE | Primary: Student in an Organized Health Care Education/Training Program

## 2023-12-04 ENCOUNTER — Encounter
Admit: 2023-12-04 | Discharge: 2023-12-04 | Payer: MEDICARE | Primary: Student in an Organized Health Care Education/Training Program

## 2023-12-07 ENCOUNTER — Encounter
Admit: 2023-12-07 | Discharge: 2023-12-07 | Payer: MEDICARE | Primary: Student in an Organized Health Care Education/Training Program

## 2023-12-07 NOTE — Progress Notes
 Contacted Jerzy Crotteau Crites to refill their medication(s) MYCOPHENOLATE  SODIUM 180 MG PO TBEC.    We were unable to reach the patient after three attempts. At this time, we will no longer attempt to contact the patient for the refill. The patient may be re-enrolled in the pharmacy refill management program at any time by contacting the pharmacy or refilling their medication. Ambulatory pharmacist notified.      Nolene Hoar  [facility]  [phone]

## 2023-12-08 ENCOUNTER — Encounter
Admit: 2023-12-08 | Discharge: 2023-12-08 | Payer: MEDICARE | Primary: Student in an Organized Health Care Education/Training Program

## 2023-12-09 ENCOUNTER — Encounter
Admit: 2023-12-09 | Discharge: 2023-12-09 | Payer: MEDICARE | Primary: Student in an Organized Health Care Education/Training Program

## 2023-12-10 ENCOUNTER — Encounter
Admit: 2023-12-10 | Discharge: 2023-12-10 | Payer: MEDICARE | Primary: Student in an Organized Health Care Education/Training Program

## 2023-12-10 MED FILL — MYCOPHENOLATE SODIUM 180 MG PO TBEC: 180 mg | ORAL | 90 days supply | Qty: 180 | Fill #0 | Status: AC

## 2023-12-10 MED FILL — OMEPRAZOLE 20 MG PO CPDR: 20 mg | ORAL | 90 days supply | Qty: 90 | Fill #1 | Status: AC

## 2023-12-11 ENCOUNTER — Encounter
Admit: 2023-12-11 | Discharge: 2023-12-11 | Payer: MEDICARE | Primary: Student in an Organized Health Care Education/Training Program

## 2023-12-12 ENCOUNTER — Encounter
Admit: 2023-12-12 | Discharge: 2023-12-12 | Payer: MEDICARE | Primary: Student in an Organized Health Care Education/Training Program

## 2023-12-12 MED FILL — TACROLIMUS 1 MG PO CAP: 1 mg | ORAL | 30 days supply | Qty: 120 | Fill #0 | Status: AC

## 2023-12-14 ENCOUNTER — Encounter
Admit: 2023-12-14 | Discharge: 2023-12-14 | Payer: MEDICARE | Primary: Student in an Organized Health Care Education/Training Program

## 2023-12-24 ENCOUNTER — Encounter
Admit: 2023-12-24 | Discharge: 2023-12-24 | Payer: MEDICARE | Primary: Student in an Organized Health Care Education/Training Program

## 2023-12-28 ENCOUNTER — Encounter
Admit: 2023-12-28 | Discharge: 2023-12-28 | Payer: MEDICARE | Primary: Student in an Organized Health Care Education/Training Program

## 2023-12-30 ENCOUNTER — Encounter
Admit: 2023-12-30 | Discharge: 2023-12-30 | Payer: MEDICARE | Primary: Student in an Organized Health Care Education/Training Program

## 2023-12-31 ENCOUNTER — Encounter
Admit: 2023-12-31 | Discharge: 2023-12-31 | Payer: MEDICARE | Primary: Student in an Organized Health Care Education/Training Program

## 2024-01-04 ENCOUNTER — Encounter
Admit: 2024-01-04 | Discharge: 2024-01-04 | Payer: MEDICARE | Primary: Student in an Organized Health Care Education/Training Program

## 2024-01-05 ENCOUNTER — Encounter
Admit: 2024-01-05 | Discharge: 2024-01-05 | Payer: MEDICARE | Primary: Student in an Organized Health Care Education/Training Program

## 2024-01-06 ENCOUNTER — Encounter
Admit: 2024-01-06 | Discharge: 2024-01-06 | Payer: MEDICARE | Primary: Student in an Organized Health Care Education/Training Program

## 2024-01-06 NOTE — Progress Notes [1]
 Contacted Audrey Oliver to refill their medication(s) TACROLIMUS  1 MG PO GRPK.    We were unable to reach the patient after three attempts. At this time, we will no longer attempt to contact the patient for the refill. The patient may be re-enrolled in the pharmacy refill management program at any time by contacting the pharmacy or refilling their medication. Ambulatory pharmacist notified.      Ginnie Hutchinson  [facility]  [phone]

## 2024-02-09 ENCOUNTER — Encounter
Admit: 2024-02-09 | Discharge: 2024-02-09 | Payer: MEDICARE | Primary: Student in an Organized Health Care Education/Training Program

## 2024-02-12 ENCOUNTER — Encounter
Admit: 2024-02-12 | Discharge: 2024-02-12 | Payer: MEDICARE | Primary: Student in an Organized Health Care Education/Training Program

## 2024-02-23 ENCOUNTER — Encounter
Admit: 2024-02-23 | Discharge: 2024-02-23 | Payer: MEDICARE | Primary: Student in an Organized Health Care Education/Training Program

## 2024-02-23 DIAGNOSIS — D849 Immunodeficiency, unspecified: Principal | ICD-10-CM

## 2024-02-23 DIAGNOSIS — Z944 Liver transplant status: Secondary | ICD-10-CM

## 2024-02-23 MED ORDER — MYCOPHENOLATE SODIUM 180 MG PO TBEC
180 mg | ORAL_TABLET | Freq: Two times a day (BID) | ORAL | 0 refills | 30.00000 days | Status: AC
Start: 2024-02-23 — End: ?
  Filled 2024-03-01: qty 60, 30d supply, fill #0

## 2024-02-23 MED ORDER — OMEPRAZOLE 20 MG PO CPDR
20 mg | ORAL_CAPSULE | Freq: Every day | ORAL | 1 refills | 90.00000 days | Status: AC
Start: 2024-02-23 — End: ?
  Filled 2024-03-08: qty 90, 90d supply, fill #0

## 2024-02-24 ENCOUNTER — Encounter
Admit: 2024-02-24 | Discharge: 2024-02-24 | Payer: MEDICARE | Primary: Student in an Organized Health Care Education/Training Program

## 2024-02-26 ENCOUNTER — Encounter
Admit: 2024-02-26 | Discharge: 2024-02-26 | Payer: MEDICARE | Primary: Student in an Organized Health Care Education/Training Program

## 2024-02-29 ENCOUNTER — Encounter
Admit: 2024-02-29 | Discharge: 2024-02-29 | Payer: MEDICARE | Primary: Student in an Organized Health Care Education/Training Program

## 2024-03-01 ENCOUNTER — Encounter
Admit: 2024-03-01 | Discharge: 2024-03-01 | Payer: MEDICARE | Primary: Student in an Organized Health Care Education/Training Program

## 2024-03-08 ENCOUNTER — Encounter
Admit: 2024-03-08 | Discharge: 2024-03-08 | Payer: MEDICARE | Primary: Student in an Organized Health Care Education/Training Program

## 2024-03-10 ENCOUNTER — Encounter
Admit: 2024-03-10 | Discharge: 2024-03-10 | Payer: MEDICARE | Primary: Student in an Organized Health Care Education/Training Program

## 2024-03-10 DIAGNOSIS — D849 Immunodeficiency, unspecified: Principal | ICD-10-CM

## 2024-03-10 DIAGNOSIS — Z944 Liver transplant status: Secondary | ICD-10-CM

## 2024-03-10 MED ORDER — MYCOPHENOLATE SODIUM 180 MG PO TBEC
180 mg | ORAL_TABLET | Freq: Two times a day (BID) | ORAL | 0 refills | Status: CN
Start: 2024-03-10 — End: ?

## 2024-03-11 ENCOUNTER — Encounter
Admit: 2024-03-11 | Discharge: 2024-03-11 | Payer: MEDICARE | Primary: Student in an Organized Health Care Education/Training Program

## 2024-03-14 ENCOUNTER — Encounter
Admit: 2024-03-14 | Discharge: 2024-03-14 | Payer: MEDICARE | Primary: Student in an Organized Health Care Education/Training Program

## 2024-03-16 ENCOUNTER — Encounter
Admit: 2024-03-16 | Discharge: 2024-03-16 | Payer: MEDICARE | Primary: Student in an Organized Health Care Education/Training Program

## 2024-03-18 ENCOUNTER — Encounter
Admit: 2024-03-18 | Discharge: 2024-03-18 | Payer: MEDICARE | Primary: Student in an Organized Health Care Education/Training Program

## 2024-03-18 MED FILL — TACROLIMUS 1 MG PO CAP: 1 mg | ORAL | 30 days supply | Qty: 120 | Fill #1 | Status: AC

## 2024-03-21 ENCOUNTER — Encounter
Admit: 2024-03-21 | Discharge: 2024-03-21 | Payer: MEDICARE | Primary: Student in an Organized Health Care Education/Training Program

## 2024-03-21 NOTE — Progress Notes [1]
 Pharmacy Benefits Investigation    Medication name: tacrolimus  (PROGRAF ) 1 mg capsule  Medication status: continuation (refill)    The out of pocket cost today is $0 for 1 month. This cost may change due to factors including but not limited to changes in insurance coverage.    The copay is affordable. The patient is not due for a refill at this time, the prescription will be filled through the normal refill process.      Randine Loosen  Specialty Pharmacy Patient Advocate

## 2024-03-22 ENCOUNTER — Encounter
Admit: 2024-03-22 | Discharge: 2024-03-22 | Payer: MEDICARE | Primary: Student in an Organized Health Care Education/Training Program

## 2024-03-22 NOTE — Telephone Encounter [36]
 Pt called requesting to speak to nurse regarding her labs since she has an appt tomorrow.   [facility]

## 2024-03-22 NOTE — Telephone Encounter [36]
 Patient called to state she will get labs at 7 am in the morning at [facility]. This NC stated lab orders were in through [facility] however this NC would re-send standing lab orders to [facility] [employer] [facility] as well as mail patient her lab orders. Patient states she is frustrated because she sees that she has a urine drug screen ordered. This NC verbalized that having a peth and urine drug screen post transplant is commonly ordered to ensure patients are complaint with transplant policy. Patient last did last have urine drug screen in August however which only showed positivity for THC. 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 Patient verbalized an understanding and that this is for her liver health monitoring. Patient stated  I use THC so that will be positive and I do not plan on quiting. This NC verbalized an understanding. This NC will send standing lab orders to [employer] and mail to patient. Confirmed patient's mailing address.

## 2024-03-23 ENCOUNTER — Ambulatory Visit
Admit: 2024-03-23 | Discharge: 2024-03-24 | Payer: MEDICARE | Primary: Student in an Organized Health Care Education/Training Program

## 2024-03-23 ENCOUNTER — Encounter
Admit: 2024-03-23 | Discharge: 2024-03-23 | Payer: MEDICARE | Primary: Student in an Organized Health Care Education/Training Program

## 2024-03-23 DIAGNOSIS — E782 Mixed hyperlipidemia: Secondary | ICD-10-CM

## 2024-03-23 DIAGNOSIS — 1 ERRONEOUS ENCOUNTER--DISREGARD: Secondary | ICD-10-CM

## 2024-03-23 DIAGNOSIS — E66812 Class 2 severe obesity due to excess calories with serious comorbidity in adult, unspecified BMI: Secondary | ICD-10-CM

## 2024-03-23 DIAGNOSIS — Z940 Kidney transplant status: Secondary | ICD-10-CM

## 2024-03-23 DIAGNOSIS — F109 Alcohol use disorder: Secondary | ICD-10-CM

## 2024-03-23 DIAGNOSIS — D849 Immunodeficiency, unspecified: Secondary | ICD-10-CM

## 2024-03-23 DIAGNOSIS — Z944 Liver transplant status: Principal | ICD-10-CM

## 2024-03-23 NOTE — Progress Notes [1]
 This encounter was created in error. Please disregard.

## 2024-03-25 ENCOUNTER — Encounter
Admit: 2024-03-25 | Discharge: 2024-03-25 | Payer: MEDICARE | Primary: Student in an Organized Health Care Education/Training Program

## 2024-03-30 ENCOUNTER — Encounter
Admit: 2024-03-30 | Discharge: 2024-03-30 | Payer: MEDICARE | Primary: Student in an Organized Health Care Education/Training Program

## 2024-04-01 ENCOUNTER — Encounter
Admit: 2024-04-01 | Discharge: 2024-04-01 | Payer: MEDICARE | Primary: Student in an Organized Health Care Education/Training Program

## 2024-04-05 ENCOUNTER — Encounter
Admit: 2024-04-05 | Discharge: 2024-04-05 | Payer: MEDICARE | Primary: Student in an Organized Health Care Education/Training Program

## 2024-04-07 ENCOUNTER — Encounter
Admit: 2024-04-07 | Discharge: 2024-04-07 | Payer: MEDICARE | Primary: Student in an Organized Health Care Education/Training Program
